# Patient Record
Sex: Male | Born: 1965 | Race: Black or African American | Hispanic: No | Marital: Married | Smoking: Former smoker
Health system: Southern US, Community
[De-identification: ages and names within clinical notes are randomized; demographics above are authoritative.]

## PROBLEM LIST (undated history)

## (undated) DIAGNOSIS — F419 Anxiety disorder, unspecified: Secondary | ICD-10-CM

## (undated) DIAGNOSIS — K5790 Diverticulosis of intestine, part unspecified, without perforation or abscess without bleeding: Secondary | ICD-10-CM

## (undated) DIAGNOSIS — K219 Gastro-esophageal reflux disease without esophagitis: Secondary | ICD-10-CM

## (undated) DIAGNOSIS — M1990 Unspecified osteoarthritis, unspecified site: Secondary | ICD-10-CM

## (undated) DIAGNOSIS — M2550 Pain in unspecified joint: Secondary | ICD-10-CM

## (undated) DIAGNOSIS — H409 Unspecified glaucoma: Secondary | ICD-10-CM

## (undated) DIAGNOSIS — R011 Cardiac murmur, unspecified: Secondary | ICD-10-CM

## (undated) DIAGNOSIS — R51 Headache: Secondary | ICD-10-CM

## (undated) DIAGNOSIS — R519 Headache, unspecified: Secondary | ICD-10-CM

## (undated) DIAGNOSIS — M2540 Effusion, unspecified joint: Secondary | ICD-10-CM

## (undated) DIAGNOSIS — L509 Urticaria, unspecified: Secondary | ICD-10-CM

## (undated) DIAGNOSIS — I10 Essential (primary) hypertension: Secondary | ICD-10-CM

## (undated) DIAGNOSIS — D649 Anemia, unspecified: Secondary | ICD-10-CM

## (undated) DIAGNOSIS — J189 Pneumonia, unspecified organism: Secondary | ICD-10-CM

## (undated) DIAGNOSIS — T783XXA Angioneurotic edema, initial encounter: Secondary | ICD-10-CM

## (undated) HISTORY — PX: OTHER SURGICAL HISTORY: SHX169

## (undated) HISTORY — DX: Angioneurotic edema, initial encounter: T78.3XXA

## (undated) HISTORY — PX: WISDOM TOOTH EXTRACTION: SHX21

## (undated) HISTORY — DX: Urticaria, unspecified: L50.9

## (undated) HISTORY — PX: COLONOSCOPY: SHX174

## (undated) HISTORY — PX: ESOPHAGOGASTRODUODENOSCOPY: SHX1529

## (undated) HISTORY — DX: Anxiety disorder, unspecified: F41.9

## (undated) HISTORY — DX: Gastro-esophageal reflux disease without esophagitis: K21.9

## (undated) HISTORY — DX: Unspecified glaucoma: H40.9

---

## 1998-05-20 HISTORY — PX: OTHER SURGICAL HISTORY: SHX169

## 2014-05-20 HISTORY — PX: COLONOSCOPY: SHX174

## 2014-07-08 ENCOUNTER — Other Ambulatory Visit (HOSPITAL_COMMUNITY): Payer: Self-pay | Admitting: Orthopaedic Surgery

## 2014-07-14 ENCOUNTER — Encounter (HOSPITAL_COMMUNITY): Payer: Self-pay

## 2014-07-14 ENCOUNTER — Encounter (HOSPITAL_COMMUNITY)
Admission: RE | Admit: 2014-07-14 | Discharge: 2014-07-14 | Disposition: A | Discharge status: Home / Self Care | Payer: Managed Care, Other (non HMO) | Source: Ambulatory Visit | Attending: Orthopaedic Surgery | Admitting: Orthopaedic Surgery

## 2014-07-14 ENCOUNTER — Other Ambulatory Visit: Payer: Self-pay

## 2014-07-14 DIAGNOSIS — Z01812 Encounter for preprocedural laboratory examination: Secondary | ICD-10-CM | POA: Insufficient documentation

## 2014-07-14 DIAGNOSIS — M1612 Unilateral primary osteoarthritis, left hip: Secondary | ICD-10-CM | POA: Diagnosis not present

## 2014-07-14 DIAGNOSIS — Z01810 Encounter for preprocedural cardiovascular examination: Secondary | ICD-10-CM | POA: Diagnosis not present

## 2014-07-14 HISTORY — DX: Diverticulosis of intestine, part unspecified, without perforation or abscess without bleeding: K57.90

## 2014-07-14 HISTORY — DX: Essential (primary) hypertension: I10

## 2014-07-14 HISTORY — DX: Unspecified osteoarthritis, unspecified site: M19.90

## 2014-07-14 HISTORY — DX: Anemia, unspecified: D64.9

## 2014-07-14 HISTORY — DX: Pneumonia, unspecified organism: J18.9

## 2014-07-14 HISTORY — DX: Effusion, unspecified joint: M25.40

## 2014-07-14 HISTORY — DX: Cardiac murmur, unspecified: R01.1

## 2014-07-14 HISTORY — DX: Headache, unspecified: R51.9

## 2014-07-14 HISTORY — DX: Headache: R51

## 2014-07-14 HISTORY — DX: Pain in unspecified joint: M25.50

## 2014-07-14 LAB — COMPREHENSIVE METABOLIC PANEL
ALBUMIN: 4.2 g/dL (ref 3.5–5.2)
ALT: 26 U/L (ref 0–53)
ANION GAP: 7 (ref 5–15)
AST: 27 U/L (ref 0–37)
Alkaline Phosphatase: 43 U/L (ref 39–117)
BUN: 14 mg/dL (ref 6–23)
CALCIUM: 9.9 mg/dL (ref 8.4–10.5)
CHLORIDE: 101 mmol/L (ref 96–112)
CO2: 29 mmol/L (ref 19–32)
Creatinine, Ser: 1.17 mg/dL (ref 0.50–1.35)
GFR calc non Af Amer: 71 mL/min — ABNORMAL LOW (ref >90)
GFR, EST AFRICAN AMERICAN: 82 mL/min — AB (ref >90)
GLUCOSE: 94 mg/dL (ref 70–99)
POTASSIUM: 4.3 mmol/L (ref 3.5–5.1)
SODIUM: 137 mmol/L (ref 135–145)
Total Bilirubin: 2 mg/dL — ABNORMAL HIGH (ref 0.3–1.2)
Total Protein: 7.7 g/dL (ref 6.0–8.3)

## 2014-07-14 LAB — CBC
HCT: 41.8 % (ref 39.0–52.0)
Hemoglobin: 13.6 g/dL (ref 13.0–17.0)
MCH: 25.5 pg — ABNORMAL LOW (ref 26.0–34.0)
MCHC: 32.5 g/dL (ref 30.0–36.0)
MCV: 78.3 fL (ref 78.0–100.0)
PLATELETS: 204 10*3/uL (ref 150–400)
RBC: 5.34 MIL/uL (ref 4.22–5.81)
RDW: 14.7 % (ref 11.5–15.5)
WBC: 2.7 10*3/uL — AB (ref 4.0–10.5)

## 2014-07-14 LAB — SURGICAL PCR SCREEN
MRSA, PCR: NEGATIVE
Staphylococcus aureus: NEGATIVE

## 2014-07-14 LAB — APTT: aPTT: 28 seconds (ref 24–37)

## 2014-07-14 LAB — PROTIME-INR
INR: 1.14 (ref 0.00–1.49)
PROTHROMBIN TIME: 14.7 s (ref 11.6–15.2)

## 2014-07-14 NOTE — Progress Notes (Addendum)
Medical Md is Dr.Thomas Whyte   Pt doesn't have a cardiologist  Denies ever having an echo/stress test/heart cath  Denies EKG or CXR in past yr

## 2014-07-14 NOTE — Pre-Procedure Instructions (Signed)
Vincent Holt  07/14/2014   Your procedure is scheduled on:  Tues, Mar 8 @ 1:00 PM  Report to [facility]  at 11:00 AM.  Call this number if you have problems the morning of surgery: [phone]   Remember:   Do not eat food or drink liquids after midnight.   Take these medicines the morning of surgery with A SIP OF WATER:               No Goody's,BC's,Aleve,Aspirin,Ibuprofen,Fish Oil,or any Herbal Medications.   Do not wear jewelry.  Do not wear lotions, powders, or colognes. You may wear deodorant.  Men may shave face and neck.  Do not bring valuables to the hospital.  [facility] is not responsible                  for any belongings or valuables.               Contacts, dentures or bridgework may not be worn into surgery.  Leave suitcase in the car. After surgery it may be brought to your room.  For patients admitted to the hospital, discharge time is determined by your                treatment team.                 Special Instructions:  [facility] - Preparing for Surgery  Before surgery, you can play an important role.  Because skin is not sterile, your skin needs to be as free of germs as possible.  You can reduce the number of germs on you skin by washing with CHG (chlorahexidine gluconate) soap before surgery.  CHG is an antiseptic cleaner which kills germs and bonds with the skin to continue killing germs even after washing.  Please DO NOT use if you have an allergy to CHG or antibacterial soaps.  If your skin becomes reddened/irritated stop using the CHG and inform your nurse when you arrive at Short Stay.  Do not shave (including legs and underarms) for at least 48 hours prior to the first CHG shower.  You may shave your face.  Please follow these instructions carefully:   1.  Shower with CHG Soap the night before surgery and the                                morning of Surgery.  2.  If you choose to wash your hair, wash your hair first as usual with your        normal shampoo.  3.  After you shampoo, rinse your hair and body thoroughly to remove the                      Shampoo.  4.  Use CHG as you would any other liquid soap.  You can apply chg directly       to the skin and wash gently with scrungie or a clean washcloth.  5.  Apply the CHG Soap to your body ONLY FROM THE NECK DOWN.        Do not use on open wounds or open sores.  Avoid contact with your eyes,       ears, mouth and genitals (private parts).  Wash genitals (private parts)       with your normal soap.  6.  Wash thoroughly, paying special attention to the  area where your surgery        will be performed.  7.  Thoroughly rinse your body with warm water from the neck down.  8.  DO NOT shower/wash with your normal soap after using and rinsing off       the CHG Soap.  9.  Pat yourself dry with a clean towel.            10.  Wear clean pajamas.            11.  Place clean sheets on your bed the night of your first shower and do not        sleep with pets.  Day of Surgery  Do not apply any lotions/deoderants the morning of surgery.  Please wear clean clothes to the hospital/surgery center.     Please read over the following fact sheets that you were given: Pain Booklet, Coughing and Deep Breathing, MRSA Information and Surgical Site Infection Prevention

## 2014-07-25 MED ORDER — CEFAZOLIN SODIUM-DEXTROSE 2-3 GM-% IV SOLR
2.0000 g | INTRAVENOUS | Status: AC
  Administered 2014-07-26: 2 g via INTRAVENOUS
  Filled 2014-07-25: qty 50

## 2014-07-26 ENCOUNTER — Inpatient Hospital Stay (HOSPITAL_COMMUNITY): Payer: Managed Care, Other (non HMO) | Admitting: Anesthesiology

## 2014-07-26 ENCOUNTER — Inpatient Hospital Stay (HOSPITAL_COMMUNITY)
Admission: RE | Admit: 2014-07-26 | Discharge: 2014-07-28 | DRG: 470 | Disposition: A | Discharge status: Home Health | Payer: Managed Care, Other (non HMO) | Source: Ambulatory Visit | Attending: Orthopaedic Surgery | Admitting: Orthopaedic Surgery

## 2014-07-26 ENCOUNTER — Inpatient Hospital Stay (HOSPITAL_COMMUNITY): Payer: Managed Care, Other (non HMO)

## 2014-07-26 ENCOUNTER — Encounter (HOSPITAL_COMMUNITY): Payer: Self-pay | Admitting: *Deleted

## 2014-07-26 ENCOUNTER — Encounter (HOSPITAL_COMMUNITY)
Admission: RE | Disposition: A | Discharge status: Home Health | Payer: Self-pay | Source: Ambulatory Visit | Attending: Orthopaedic Surgery

## 2014-07-26 DIAGNOSIS — Z419 Encounter for procedure for purposes other than remedying health state, unspecified: Secondary | ICD-10-CM

## 2014-07-26 DIAGNOSIS — Z87891 Personal history of nicotine dependence: Secondary | ICD-10-CM | POA: Diagnosis not present

## 2014-07-26 DIAGNOSIS — M1612 Unilateral primary osteoarthritis, left hip: Principal | ICD-10-CM | POA: Diagnosis present

## 2014-07-26 DIAGNOSIS — Z96642 Presence of left artificial hip joint: Secondary | ICD-10-CM

## 2014-07-26 DIAGNOSIS — M25552 Pain in left hip: Secondary | ICD-10-CM | POA: Diagnosis present

## 2014-07-26 DIAGNOSIS — I10 Essential (primary) hypertension: Secondary | ICD-10-CM | POA: Diagnosis present

## 2014-07-26 HISTORY — PX: TOTAL HIP ARTHROPLASTY: SHX124

## 2014-07-26 SURGERY — ARTHROPLASTY, HIP, TOTAL, ANTERIOR APPROACH
Anesthesia: General | Site: Hip | Laterality: Left

## 2014-07-26 MED ORDER — ZOLPIDEM TARTRATE 5 MG PO TABS: 5.0000 mg | ORAL_TABLET | Freq: Every evening | ORAL | Status: DC | PRN

## 2014-07-26 MED ORDER — PROPOFOL 10 MG/ML IV BOLUS
INTRAVENOUS | Status: DC | PRN
  Administered 2014-07-26: 200 mg via INTRAVENOUS

## 2014-07-26 MED ORDER — OXYCODONE HCL 5 MG PO TABS
ORAL_TABLET | ORAL | Status: AC
  Administered 2014-07-26: 10 mg via ORAL
  Filled 2014-07-26: qty 2

## 2014-07-26 MED ORDER — SODIUM CHLORIDE 0.9 % IV SOLN: INTRAVENOUS | Status: DC

## 2014-07-26 MED ORDER — NEOSTIGMINE METHYLSULFATE 10 MG/10ML IV SOLN
INTRAVENOUS | Status: DC | PRN
  Administered 2014-07-26: 3 mg via INTRAVENOUS

## 2014-07-26 MED ORDER — ROCURONIUM BROMIDE 100 MG/10ML IV SOLN
INTRAVENOUS | Status: DC | PRN
  Administered 2014-07-26: 30 mg via INTRAVENOUS
  Administered 2014-07-26: 50 mg via INTRAVENOUS

## 2014-07-26 MED ORDER — POLYETHYLENE GLYCOL 3350 17 G PO PACK: 17.0000 g | PACK | Freq: Every day | ORAL | Status: DC | PRN

## 2014-07-26 MED ORDER — DEXAMETHASONE SODIUM PHOSPHATE 4 MG/ML IJ SOLN
INTRAMUSCULAR | Status: AC
  Filled 2014-07-26: qty 1

## 2014-07-26 MED ORDER — PROPOFOL 10 MG/ML IV BOLUS
INTRAVENOUS | Status: AC
  Filled 2014-07-26: qty 20

## 2014-07-26 MED ORDER — ACETAMINOPHEN 325 MG PO TABS
650.0000 mg | ORAL_TABLET | Freq: Four times a day (QID) | ORAL | Status: DC | PRN
  Administered 2014-07-27: 650 mg via ORAL
  Filled 2014-07-26: qty 2

## 2014-07-26 MED ORDER — HYDROMORPHONE HCL 1 MG/ML IJ SOLN
1.0000 mg | INTRAMUSCULAR | Status: DC | PRN
  Administered 2014-07-26 – 2014-07-27 (×3): 1 mg via INTRAVENOUS
  Filled 2014-07-26 (×3): qty 1

## 2014-07-26 MED ORDER — MIDAZOLAM HCL 2 MG/2ML IJ SOLN
INTRAMUSCULAR | Status: AC
  Filled 2014-07-26: qty 2

## 2014-07-26 MED ORDER — OXYCODONE HCL 5 MG PO TABS
5.0000 mg | ORAL_TABLET | ORAL | Status: DC | PRN
  Administered 2014-07-26 – 2014-07-27 (×7): 10 mg via ORAL
  Filled 2014-07-26 (×6): qty 2

## 2014-07-26 MED ORDER — MIDAZOLAM HCL 5 MG/5ML IJ SOLN
INTRAMUSCULAR | Status: DC | PRN
  Administered 2014-07-26: 2 mg via INTRAVENOUS

## 2014-07-26 MED ORDER — METOCLOPRAMIDE HCL 10 MG PO TABS: 5.0000 mg | ORAL_TABLET | Freq: Three times a day (TID) | ORAL | Status: DC | PRN

## 2014-07-26 MED ORDER — DEXTROSE 5 % IV SOLN
500.0000 mg | Freq: Four times a day (QID) | INTRAVENOUS | Status: DC | PRN
  Filled 2014-07-26: qty 5

## 2014-07-26 MED ORDER — ONDANSETRON HCL 4 MG PO TABS: 4.0000 mg | ORAL_TABLET | Freq: Four times a day (QID) | ORAL | Status: DC | PRN

## 2014-07-26 MED ORDER — CEFAZOLIN SODIUM 1-5 GM-% IV SOLN
1.0000 g | Freq: Four times a day (QID) | INTRAVENOUS | Status: AC
  Administered 2014-07-26 – 2014-07-27 (×2): 1 g via INTRAVENOUS
  Filled 2014-07-26 (×3): qty 50

## 2014-07-26 MED ORDER — SODIUM CHLORIDE 0.9 % IR SOLN
Status: DC | PRN
  Administered 2014-07-26: 1000 mL

## 2014-07-26 MED ORDER — MENTHOL 3 MG MT LOZG: 1.0000 | LOZENGE | OROMUCOSAL | Status: DC | PRN

## 2014-07-26 MED ORDER — METHOCARBAMOL 500 MG PO TABS
500.0000 mg | ORAL_TABLET | Freq: Four times a day (QID) | ORAL | Status: DC | PRN
Start: 2014-07-26 — End: 2014-07-28
  Administered 2014-07-26 – 2014-07-27 (×3): 500 mg via ORAL
  Filled 2014-07-26 (×4): qty 1

## 2014-07-26 MED ORDER — GLYCOPYRROLATE 0.2 MG/ML IJ SOLN
INTRAMUSCULAR | Status: DC | PRN
  Administered 2014-07-26: 0.2 mg via INTRAVENOUS
  Administered 2014-07-26: 0.4 mg via INTRAVENOUS

## 2014-07-26 MED ORDER — DEXAMETHASONE SODIUM PHOSPHATE 4 MG/ML IJ SOLN
INTRAMUSCULAR | Status: DC | PRN
  Administered 2014-07-26: 4 mg via INTRAVENOUS

## 2014-07-26 MED ORDER — TRANEXAMIC ACID 100 MG/ML IV SOLN
1000.0000 mg | INTRAVENOUS | Status: AC
  Administered 2014-07-26: 1000 mg via INTRAVENOUS
  Filled 2014-07-26: qty 10

## 2014-07-26 MED ORDER — FENTANYL CITRATE 0.05 MG/ML IJ SOLN
INTRAMUSCULAR | Status: AC
  Filled 2014-07-26: qty 5

## 2014-07-26 MED ORDER — HYDROMORPHONE HCL 1 MG/ML IJ SOLN
0.2500 mg | INTRAMUSCULAR | Status: DC | PRN
  Administered 2014-07-26 (×2): 0.5 mg via INTRAVENOUS

## 2014-07-26 MED ORDER — PHENOL 1.4 % MT LIQD: 1.0000 | OROMUCOSAL | Status: DC | PRN

## 2014-07-26 MED ORDER — ROCURONIUM BROMIDE 50 MG/5ML IV SOLN
INTRAVENOUS | Status: AC
  Filled 2014-07-26: qty 1

## 2014-07-26 MED ORDER — METOCLOPRAMIDE HCL 5 MG/ML IJ SOLN: 5.0000 mg | Freq: Three times a day (TID) | INTRAMUSCULAR | Status: DC | PRN

## 2014-07-26 MED ORDER — BISOPROLOL-HYDROCHLOROTHIAZIDE 2.5-6.25 MG PO TABS
2.0000 | ORAL_TABLET | Freq: Every day | ORAL | Status: DC
  Administered 2014-07-27 – 2014-07-28 (×2): 2 via ORAL
  Filled 2014-07-26 (×2): qty 2

## 2014-07-26 MED ORDER — DIPHENHYDRAMINE HCL 12.5 MG/5ML PO ELIX: 12.5000 mg | ORAL_SOLUTION | ORAL | Status: DC | PRN

## 2014-07-26 MED ORDER — LIDOCAINE HCL (CARDIAC) 20 MG/ML IV SOLN
INTRAVENOUS | Status: DC | PRN
  Administered 2014-07-26: 80 mg via INTRAVENOUS

## 2014-07-26 MED ORDER — HYDROMORPHONE HCL 1 MG/ML IJ SOLN
INTRAMUSCULAR | Status: AC
  Administered 2014-07-26: 0.5 mg via INTRAVENOUS
  Filled 2014-07-26: qty 1

## 2014-07-26 MED ORDER — ONDANSETRON HCL 4 MG/2ML IJ SOLN
INTRAMUSCULAR | Status: DC | PRN
  Administered 2014-07-26: 4 mg via INTRAVENOUS

## 2014-07-26 MED ORDER — PROMETHAZINE HCL 25 MG/ML IJ SOLN
6.2500 mg | INTRAMUSCULAR | Status: DC | PRN
Start: 2014-07-26 — End: 2014-07-26

## 2014-07-26 MED ORDER — ALUM & MAG HYDROXIDE-SIMETH 200-200-20 MG/5ML PO SUSP
30.0000 mL | ORAL | Status: DC | PRN
Start: 2014-07-26 — End: 2014-07-28

## 2014-07-26 MED ORDER — FENTANYL CITRATE 0.05 MG/ML IJ SOLN
INTRAMUSCULAR | Status: DC | PRN
  Administered 2014-07-26: 50 ug via INTRAVENOUS
  Administered 2014-07-26: 150 ug via INTRAVENOUS
  Administered 2014-07-26: 50 ug via INTRAVENOUS

## 2014-07-26 MED ORDER — LACTATED RINGERS IV SOLN
INTRAVENOUS | Status: DC
  Administered 2014-07-26 (×2): via INTRAVENOUS

## 2014-07-26 MED ORDER — GLYCOPYRROLATE 0.2 MG/ML IJ SOLN
INTRAMUSCULAR | Status: AC
  Filled 2014-07-26: qty 2

## 2014-07-26 MED ORDER — GLYCOPYRROLATE 0.2 MG/ML IJ SOLN
INTRAMUSCULAR | Status: AC
  Filled 2014-07-26: qty 1

## 2014-07-26 MED ORDER — DOCUSATE SODIUM 100 MG PO CAPS
100.0000 mg | ORAL_CAPSULE | Freq: Two times a day (BID) | ORAL | Status: DC
  Administered 2014-07-26 – 2014-07-27 (×3): 100 mg via ORAL
  Filled 2014-07-26 (×6): qty 1

## 2014-07-26 MED ORDER — ACETAMINOPHEN 650 MG RE SUPP: 650.0000 mg | Freq: Four times a day (QID) | RECTAL | Status: DC | PRN

## 2014-07-26 MED ORDER — EPHEDRINE SULFATE 50 MG/ML IJ SOLN
INTRAMUSCULAR | Status: DC | PRN
  Administered 2014-07-26 (×3): 5 mg via INTRAVENOUS

## 2014-07-26 MED ORDER — 0.9 % SODIUM CHLORIDE (POUR BTL) OPTIME
TOPICAL | Status: DC | PRN
  Administered 2014-07-26: 1000 mL

## 2014-07-26 MED ORDER — ASPIRIN EC 325 MG PO TBEC
325.0000 mg | DELAYED_RELEASE_TABLET | Freq: Two times a day (BID) | ORAL | Status: DC
  Administered 2014-07-26 – 2014-07-28 (×4): 325 mg via ORAL
  Filled 2014-07-26 (×4): qty 1

## 2014-07-26 MED ORDER — ONDANSETRON HCL 4 MG/2ML IJ SOLN: 4.0000 mg | Freq: Four times a day (QID) | INTRAMUSCULAR | Status: DC | PRN

## 2014-07-26 SURGICAL SUPPLY — 60 items
BENZOIN TINCTURE PRP APPL 2/3 (GAUZE/BANDAGES/DRESSINGS) ×3 IMPLANT
BLADE SAW SGTL 18X1.27X75 (BLADE) ×2 IMPLANT
BLADE SAW SGTL 18X1.27X75MM (BLADE) ×1
BLADE SURG ROTATE 9660 (MISCELLANEOUS) IMPLANT
BNDG GAUZE ELAST 4 BULKY (GAUZE/BANDAGES/DRESSINGS) IMPLANT
CAPT HIP TOTAL 2 ×3 IMPLANT
CELLS DAT CNTRL 66122 CELL SVR (MISCELLANEOUS) ×1 IMPLANT
CLOSURE WOUND 1/2 X4 (GAUZE/BANDAGES/DRESSINGS) ×2
COVER SURGICAL LIGHT HANDLE (MISCELLANEOUS) ×3 IMPLANT
DRAPE C-ARM 42X72 X-RAY (DRAPES) ×3 IMPLANT
DRAPE IMP U-DRAPE 54X76 (DRAPES) ×3 IMPLANT
DRAPE STERI IOBAN 125X83 (DRAPES) ×3 IMPLANT
DRAPE U-SHAPE 47X51 STRL (DRAPES) ×9 IMPLANT
DRESSING AQUACEL AQ EXTRA 4X5 (GAUZE/BANDAGES/DRESSINGS) ×3 IMPLANT
DRSG AQUACEL AG ADV 3.5X10 (GAUZE/BANDAGES/DRESSINGS) ×3 IMPLANT
DURAPREP 26ML APPLICATOR (WOUND CARE) ×3 IMPLANT
ELECT BLADE 4.0 EZ CLEAN MEGAD (MISCELLANEOUS)
ELECT BLADE 6.5 EXT (BLADE) ×3 IMPLANT
ELECT CAUTERY BLADE 6.4 (BLADE) ×3 IMPLANT
ELECT REM PT RETURN 9FT ADLT (ELECTROSURGICAL) ×3
ELECTRODE BLDE 4.0 EZ CLN MEGD (MISCELLANEOUS) IMPLANT
ELECTRODE REM PT RTRN 9FT ADLT (ELECTROSURGICAL) ×1 IMPLANT
FACESHIELD WRAPAROUND (MASK) ×6 IMPLANT
GLOVE BIO SURGEON STRL SZ 6.5 (GLOVE) ×2 IMPLANT
GLOVE BIO SURGEON STRL SZ7 (GLOVE) ×3 IMPLANT
GLOVE BIO SURGEONS STRL SZ 6.5 (GLOVE) ×1
GLOVE BIOGEL PI IND STRL 7.5 (GLOVE) ×1 IMPLANT
GLOVE BIOGEL PI IND STRL 8 (GLOVE) ×2 IMPLANT
GLOVE BIOGEL PI INDICATOR 7.5 (GLOVE) ×2
GLOVE BIOGEL PI INDICATOR 8 (GLOVE) ×4
GLOVE ECLIPSE 6.5 STRL STRAW (GLOVE) ×3 IMPLANT
GLOVE ECLIPSE 8.0 STRL XLNG CF (GLOVE) ×3 IMPLANT
GLOVE ORTHO TXT STRL SZ7.5 (GLOVE) ×6 IMPLANT
GOWN STRL REUS W/ TWL XL LVL3 (GOWN DISPOSABLE) ×2 IMPLANT
GOWN STRL REUS W/TWL XL LVL3 (GOWN DISPOSABLE) ×4
HANDPIECE INTERPULSE COAX TIP (DISPOSABLE) ×2
KIT BASIN OR (CUSTOM PROCEDURE TRAY) ×3 IMPLANT
KIT ROOM TURNOVER OR (KITS) ×3 IMPLANT
MANIFOLD NEPTUNE II (INSTRUMENTS) ×3 IMPLANT
NS IRRIG 1000ML POUR BTL (IV SOLUTION) ×3 IMPLANT
PACK TOTAL JOINT (CUSTOM PROCEDURE TRAY) ×3 IMPLANT
PACK UNIVERSAL I (CUSTOM PROCEDURE TRAY) ×3 IMPLANT
PAD ARMBOARD 7.5X6 YLW CONV (MISCELLANEOUS) ×6 IMPLANT
RTRCTR WOUND ALEXIS 18CM MED (MISCELLANEOUS) ×3
SET HNDPC FAN SPRY TIP SCT (DISPOSABLE) ×1 IMPLANT
SPONGE LAP 18X18 X RAY DECT (DISPOSABLE) ×3 IMPLANT
SPONGE LAP 4X18 X RAY DECT (DISPOSABLE) IMPLANT
STRIP CLOSURE SKIN 1/2X4 (GAUZE/BANDAGES/DRESSINGS) ×4 IMPLANT
SUT ETHIBOND NAB CT1 #1 30IN (SUTURE) ×3 IMPLANT
SUT MNCRL AB 4-0 PS2 18 (SUTURE) ×3 IMPLANT
SUT VIC AB 0 CT1 27 (SUTURE) ×2
SUT VIC AB 0 CT1 27XBRD ANBCTR (SUTURE) ×1 IMPLANT
SUT VIC AB 1 CT1 27 (SUTURE) ×2
SUT VIC AB 1 CT1 27XBRD ANBCTR (SUTURE) ×1 IMPLANT
SUT VIC AB 2-0 CT1 27 (SUTURE) ×2
SUT VIC AB 2-0 CT1 TAPERPNT 27 (SUTURE) ×1 IMPLANT
TOWEL OR 17X24 6PK STRL BLUE (TOWEL DISPOSABLE) ×3 IMPLANT
TOWEL OR 17X26 10 PK STRL BLUE (TOWEL DISPOSABLE) ×3 IMPLANT
TUBE SUCT ARGYLE STRL (TUBING) ×3 IMPLANT
WATER STERILE IRR 1000ML POUR (IV SOLUTION) ×6 IMPLANT

## 2014-07-26 NOTE — Brief Op Note (Signed)
07/26/2014  2:39 PM  PATIENT:  Vincent Holt  49 y.o. male  PRE-OPERATIVE DIAGNOSIS:  Severe osteoarthritis left hip  POST-OPERATIVE DIAGNOSIS:  Severe osteoarthritis left hip  PROCEDURE:  Procedure(s): LEFT TOTAL HIP ARTHROPLASTY ANTERIOR APPROACH (Left)  SURGEON:  Surgeon(s) and Role:    * Mcarthur Rossetti, MD - Primary  PHYSICIAN ASSISTANT: Benita Stabile, PA-C  ANESTHESIA:   general  EBL:  Total I/O In: 1000 [I.V.:1000] Out: 200 [Blood:200]  BLOOD ADMINISTERED:none  DRAINS: none   LOCAL MEDICATIONS USED:  NONE  SPECIMEN:  No Specimen  DISPOSITION OF SPECIMEN:  N/A  COUNTS:  YES  TOURNIQUET:  * No tourniquets in log *  DICTATION: .Other Dictation: Dictation Number [id number]  PLAN OF CARE: Admit to inpatient   PATIENT DISPOSITION:  PACU - hemodynamically stable.   Delay start of Pharmacological VTE agent (>24hrs) due to surgical blood loss or risk of bleeding: no

## 2014-07-26 NOTE — Anesthesia Preprocedure Evaluation (Addendum)
Anesthesia Evaluation  Patient identified by MRN, date of birth, ID band Patient awake    Reviewed: Allergy & Precautions, NPO status , Patient's Chart, lab work & pertinent test results  Airway Mallampati: I  TM Distance: >3 FB Neck ROM: Full    Dental  (+) Teeth Intact   Pulmonary pneumonia -, former smoker,  breath sounds clear to auscultation        Cardiovascular hypertension, Pt. on medications Rhythm:Regular Rate:Normal     Neuro/Psych    GI/Hepatic negative GI ROS, Neg liver ROS,   Endo/Other    Renal/GU negative Renal ROS     Musculoskeletal  (+) Arthritis -,   Abdominal   Peds  Hematology negative hematology ROS (+)   Anesthesia Other Findings   Reproductive/Obstetrics                            Anesthesia Physical Anesthesia Plan  ASA: II  Anesthesia Plan: General   Post-op Pain Management:    Induction: Intravenous  Airway Management Planned: Oral ETT  Additional Equipment:   Intra-op Plan:   Post-operative Plan: Extubation in OR  Informed Consent: I have reviewed the patients History and Physical, chart, labs and discussed the procedure including the risks, benefits and alternatives for the proposed anesthesia with the patient or authorized representative who has indicated his/her understanding and acceptance.   Dental advisory given  Plan Discussed with: CRNA, Anesthesiologist and Surgeon  Anesthesia Plan Comments:         Anesthesia Quick Evaluation

## 2014-07-26 NOTE — Anesthesia Postprocedure Evaluation (Signed)
  Anesthesia Post-op Note  Patient: Vincent Holt  Procedure(s) Performed: Procedure(s): LEFT TOTAL HIP ARTHROPLASTY ANTERIOR APPROACH (Left)  Patient Location: PACU  Anesthesia Type:General  Level of Consciousness: awake  Airway and Oxygen Therapy: Patient Spontanous Breathing  Post-op Pain: mild  Post-op Assessment: Post-op Vital signs reviewed  Post-op Vital Signs: Reviewed  Last Vitals:  Filed Vitals:   07/26/14 1500  BP: 145/112  Pulse: 71  Temp: 36.9 C  Resp: 18    Complications: No apparent anesthesia complications

## 2014-07-26 NOTE — Anesthesia Procedure Notes (Signed)
Procedure Name: Intubation Date/Time: 07/26/2014 1:11 PM Performed by: Rush Farmer E Pre-anesthesia Checklist: Patient identified, Emergency Drugs available, Suction available, Patient being monitored and Timeout performed Patient Re-evaluated:Patient Re-evaluated prior to inductionOxygen Delivery Method: Circle system utilized Preoxygenation: Pre-oxygenation with 100% oxygen Intubation Type: IV induction Ventilation: Mask ventilation without difficulty Laryngoscope Size: Mac and 3 Grade View: Grade I Tube type: Oral Tube size: 7.5 mm Number of attempts: 1 Airway Equipment and Method: Stylet Placement Confirmation: ETT inserted through vocal cords under direct vision,  positive ETCO2 and breath sounds checked- equal and bilateral Tube secured with: Tape Dental Injury: Teeth and Oropharynx as per pre-operative assessment

## 2014-07-26 NOTE — Transfer of Care (Signed)
Immediate Anesthesia Transfer of Care Note  Patient: Vincent Holt  Procedure(s) Performed: Procedure(s): LEFT TOTAL HIP ARTHROPLASTY ANTERIOR APPROACH (Left)  Patient Location: PACU  Anesthesia Type:General  Level of Consciousness: awake, alert  and oriented  Airway & Oxygen Therapy: Patient Spontanous Breathing and Patient connected to nasal cannula oxygen  Post-op Assessment: Report given to RN, Post -op Vital signs reviewed and stable and Patient moving all extremities  Post vital signs: Reviewed and stable  Last Vitals:  Filed Vitals:   07/26/14 1100  BP:   Pulse: 66  Temp: 36.8 C  Resp: 18    Complications: No apparent anesthesia complications

## 2014-07-26 NOTE — H&P (Signed)
TOTAL HIP ADMISSION H&P  Patient is admitted for left total hip arthroplasty.  Subjective:  Chief Complaint: left hip pain  HPI: Vincent Holt, 49 y.o. male, has a history of pain and functional disability in the left hip(s) due to arthritis and patient has failed non-surgical conservative treatments for greater than 12 weeks to include NSAID's and/or analgesics and activity modification.  Onset of symptoms was gradual starting 8 years ago with gradually worsening course since that time.The patient noted no past surgery on the left hip(s).  Patient currently rates pain in the left hip at 10 out of 10 with activity. Patient has night pain, worsening of pain with activity and weight bearing, pain that interfers with activities of daily living and pain with passive range of motion. Patient has evidence of subchondral sclerosis, periarticular osteophytes and joint space narrowing by imaging studies. This condition presents safety issues increasing the risk of falls.  There is no current active infection.  Patient Active Problem List   Diagnosis Date Noted  . Osteoarthritis of left hip 07/26/2014   Past Medical History  Diagnosis Date  . Anemia   . Hypertension     takes Bisoprolol-HCTZ daily  . Heart murmur     was told yrs ago  . Pneumonia     hx of-80's  . Headache   . Arthritis   . Joint pain   . Joint swelling   . Osteoarthritis   . Diverticulosis     Past Surgical History  Procedure Laterality Date  . Achiles tendon surgery Left   . Colonoscopy    . Esophagogastroduodenoscopy      Prescriptions prior to admission  Medication Sig Dispense Refill Last Dose  . bisoprolol-hydrochlorothiazide (ZIAC) 2.5-6.25 MG per tablet Take 2 tablets by mouth daily.   07/26/2014 at 0730  . Omalizumab (XOLAIR Knierim) Inject 1 each into the skin every 30 (thirty) days.   Past Month at Unknown time  . triamcinolone cream (KENALOG) 0.1 % Apply 1 application topically daily as needed (irritation).   Past  Week at Unknown time   No Known Allergies  History  Substance Use Topics  . Smoking status: Former Research scientist (life sciences)  . Smokeless tobacco: Not on file     Comment: quit smoking 20+yrs ago  . Alcohol Use: Yes     Comment: daily    History reviewed. No pertinent family history.   Review of Systems  Musculoskeletal: Positive for joint pain.  All other systems reviewed and are negative.   Objective:  Physical Exam  Constitutional: He is oriented to person, place, and time. He appears well-developed and well-nourished.  HENT:  Head: Normocephalic and atraumatic.  Eyes: EOM are normal. Pupils are equal, round, and reactive to light.  Neck: Normal range of motion. Neck supple.  Cardiovascular: Normal rate and regular rhythm.   Respiratory: Effort normal and breath sounds normal.  GI: Soft. Bowel sounds are normal.  Musculoskeletal:       Left hip: He exhibits decreased range of motion, decreased strength and bony tenderness.  Neurological: He is alert and oriented to person, place, and time.  Skin: Skin is warm and dry.  Psychiatric: He has a normal mood and affect.    Vital signs in last 24 hours: Temp:  [98.3 F (36.8 C)] 98.3 F (36.8 C) (03/08 1100) Pulse Rate:  [66] 66 (03/08 1100) Resp:  [18] 18 (03/08 1100) BP: (169)/(96) 169/96 mmHg (03/08 1057) SpO2:  [100 %] 100 % (03/08 1100) Weight:  [85.73 kg (  189 lb)] 85.73 kg (189 lb) (03/08 1051)  Labs:   Estimated body mass index is 28.74 kg/(m^2) as calculated from the following:   Height as of this encounter: 5\' 8"  (1.727 m).   Weight as of this encounter: 85.73 kg (189 lb).   Imaging Review Plain radiographs demonstrate severe degenerative joint disease of the left hip(s). The bone quality appears to be excellent for age and reported activity level.  Assessment/Plan:  End stage arthritis, left hip(s)  The patient history, physical examination, clinical judgement of the provider and imaging studies are consistent with end  stage degenerative joint disease of the left hip(s) and total hip arthroplasty is deemed medically necessary. The treatment options including medical management, injection therapy, arthroscopy and arthroplasty were discussed at length. The risks and benefits of total hip arthroplasty were presented and reviewed. The risks due to aseptic loosening, infection, stiffness, dislocation/subluxation,  thromboembolic complications and other imponderables were discussed.  The patient acknowledged the explanation, agreed to proceed with the plan and consent was signed. Patient is being admitted for inpatient treatment for surgery, pain control, PT, OT, prophylactic antibiotics, VTE prophylaxis, progressive ambulation and ADL's and discharge planning.The patient is planning to be discharged home with home health services

## 2014-07-26 NOTE — Plan of Care (Signed)
Problem: Consults Goal: Diagnosis- Total Joint Replacement Primary Total Hip Left     

## 2014-07-27 LAB — BASIC METABOLIC PANEL
Anion gap: 8 (ref 5–15)
BUN: 13 mg/dL (ref 6–23)
CO2: 28 mmol/L (ref 19–32)
CREATININE: 1.14 mg/dL (ref 0.50–1.35)
Calcium: 8.9 mg/dL (ref 8.4–10.5)
Chloride: 98 mmol/L (ref 96–112)
GFR calc Af Amer: 85 mL/min — ABNORMAL LOW (ref >90)
GFR calc non Af Amer: 73 mL/min — ABNORMAL LOW (ref >90)
Glucose, Bld: 122 mg/dL — ABNORMAL HIGH (ref 70–99)
POTASSIUM: 3.7 mmol/L (ref 3.5–5.1)
Sodium: 134 mmol/L — ABNORMAL LOW (ref 135–145)

## 2014-07-27 LAB — CBC
HCT: 34.5 % — ABNORMAL LOW (ref 39.0–52.0)
Hemoglobin: 11.1 g/dL — ABNORMAL LOW (ref 13.0–17.0)
MCH: 25.1 pg — ABNORMAL LOW (ref 26.0–34.0)
MCHC: 32.2 g/dL (ref 30.0–36.0)
MCV: 78.1 fL (ref 78.0–100.0)
Platelets: 176 10*3/uL (ref 150–400)
RBC: 4.42 MIL/uL (ref 4.22–5.81)
RDW: 14.1 % (ref 11.5–15.5)
WBC: 5.9 10*3/uL (ref 4.0–10.5)

## 2014-07-27 NOTE — Evaluation (Signed)
Physical Therapy Evaluation Patient Details Name: Vincent Holt MRN: [id number] DOB: [birth year] Today's Date: 07/27/2014   History of Present Illness  [name], 49 y.o. male, has a history of pain and functional disability in the left hip(s) due to arthritis and patient has failed non-surgical conservative treatments for greater than 12 weeks to include NSAID's and/or analgesics and activity modification. Patient has evidence of subchondral sclerosis, periarticular osteophytes and joint space narrowing by imaging studies.  Pt underwent L direct anterior approach THA on 07/26/2014.  Clinical Impression  Future presents with decreased ROM, strength, proprioception and balance secondary to L direct anterior approach THA.  Pt is unable to stand without UE assist/min contact guard, and demonstrated postural/LE fatigue with ambulation less than community distances.  Pt would benefit from skilled therapy to address above functional limitations and impairments.     Follow Up Recommendations Home health PT;Supervision - Intermittent    Equipment Recommendations  Rolling walker with 5" wheels    Recommendations for Other Services       Precautions / Restrictions Precautions Precautions: Anterior Hip Precaution Booklet Issued: No (direct approach) Precaution Comments: Direct Approach, No Precautions Restrictions Weight Bearing Restrictions: Yes LLE Weight Bearing: Weight bearing as tolerated      Mobility  Bed Mobility Overal bed mobility: Modified Independent                Transfers Overall transfer level: Needs assistance Equipment used: Rolling walker (2 wheeled) Transfers: Sit to/from Stand Sit to Stand: Supervision         General transfer comment: v/c's for hand placement to transfer sit to stand  Ambulation/Gait Ambulation/Gait assistance: Supervision Ambulation Distance (Feet): 150 Feet Assistive device: Rolling walker (2 wheeled) Gait Pattern/deviations:  Decreased step length - left;Step-to pattern;Decreased weight shift to left Gait velocity: Slow/guarded due pain and post-op status      Stairs            Wheelchair Mobility    Modified Rankin (Stroke Patients Only)       Balance Overall balance assessment: Needs assistance Sitting-balance support: No upper extremity supported;Feet supported Sitting balance-Leahy Scale: Good     Standing balance support: Bilateral upper extremity supported;During functional activity Standing balance-Leahy Scale: Fair Standing balance comment: Able to stand >5 minutes to  brush teeth, but needed U UE support                             Pertinent Vitals/Pain Pain Assessment: 0-10 Pain Score: 8  Pain Location: LLE Pain Descriptors / Indicators: Operative site guarding Pain Intervention(s): Monitored during session;Repositioned    Home Living Family/patient expects to be discharged to:: Private residence Living Arrangements: Children Available Help at Discharge: Other (Comment) (daughter and husband to live with patient for awhile) Type of Home: House Home Access: Stairs to enter Entrance Stairs-Rails: Left Entrance Stairs-Number of Steps: 1 Home Layout: One level Home Equipment: None      Prior Function Level of Independence: Independent         Comments: Prior to surgery worked two jobs     Journalist, newspaper   Dominant Hand: Right    Extremity/Trunk Assessment   Upper Extremity Assessment: Overall WFL for tasks assessed           Lower Extremity Assessment: LLE deficits/detail   LLE Deficits / Details: Able to perform hip and knee flexion in bed with limited range, able to ambulate with rolling walker  Cervical /  Trunk Assessment: Normal  Communication   Communication: No difficulties  Cognition Arousal/Alertness: Awake/alert Behavior During Therapy: WFL for tasks assessed/performed Overall Cognitive Status: Within Functional Limits for tasks  assessed                      General Comments      Exercises Total Joint Exercises Ankle Circles/Pumps: Seated;AROM;Both;10 reps Long Arc Quad: Seated;AROM;10 reps;Left      Assessment/Plan    PT Assessment Patient needs continued PT services  PT Diagnosis Acute pain;Difficulty walking;Generalized weakness   PT Problem List Decreased strength;Decreased range of motion;Decreased activity tolerance;Decreased balance;Decreased mobility;Decreased coordination;Decreased knowledge of use of DME;Pain  PT Treatment Interventions DME instruction;Gait training;Stair training;Functional mobility training;Therapeutic activities;Balance training;Therapeutic exercise;Neuromuscular re-education;Patient/family education   PT Goals (Current goals can be found in the Care Plan section) Acute Rehab PT Goals Patient Stated Goal: Return home PT Goal Formulation: With patient Time For Goal Achievement: 08/03/14 Potential to Achieve Goals: Good    Frequency 7X/week   Barriers to discharge        Co-evaluation               End of Session Equipment Utilized During Treatment: Gait belt Activity Tolerance: Patient tolerated treatment well Patient left: in chair;with call bell/phone within reach Nurse Communication: Mobility status         Time: 4580-9983 PT Time Calculation (min) (ACUTE ONLY): 35 min   Charges:         PT G Codes:        Sabrea Sankey 08/08/14, 11:53 AM  Lucas Mallow, SPT (student physical therapist) Office phone: [phone]

## 2014-07-27 NOTE — Progress Notes (Signed)
Physical Therapy Treatment Patient Details Name: Vincent Holt MRN: [id number] DOB: April 04, 1966 Today's Date: 07/27/2014    History of Present Illness Vincent Holt, 49 y.o. male, has a history of pain and functional disability in the left hip(s) due to arthritis and patient has failed non-surgical conservative treatments for greater than 12 weeks to include NSAID's and/or analgesics and activity modification. Patient has evidence of subchondral sclerosis, periarticular osteophytes and joint space narrowing by imaging studies.  Pt underwent direct anterior approach THA on 07/26/2014.    PT Comments    Pt tolerated treatment well and was able to navigate 2 stairs with railing on the L with a side-step pattern, with supervision.  Pt continues to progress well, but will benefit from continued therapy to address ambulation and decreased ROM, strength and balance/proprioception post-operatively.    Follow Up Recommendations  Home health PT     Equipment Recommendations  Rolling walker with 5" wheels    Recommendations for Other Services       Precautions / Restrictions Precautions Precautions: None Precaution Booklet Issued: No (direct approach) Precaution Comments: Direct Approach, No Precautions Restrictions Weight Bearing Restrictions: Yes LLE Weight Bearing: Weight bearing as tolerated    Mobility  Bed Mobility                  Transfers Overall transfer level: Modified independent Equipment used: Rolling walker (2 wheeled) Transfers: Sit to/from Stand Sit to Stand: Supervision         General transfer comment: demonstrated understanding of hand placement for sit to stand transfer  Ambulation/Gait Ambulation/Gait assistance: Supervision Ambulation Distance (Feet): 150 Feet Assistive device: Rolling walker (2 wheeled) Gait Pattern/deviations: Decreased weight shift to left;Decreased step length - left Gait velocity: Guarded/slow due to post-op status   General  Gait Details: Worked on fluidity of gait and heel to toe gait pattern; transitioning to step through pattern   Stairs Stairs: Yes Stairs assistance: Supervision Stair Management: One rail Left Number of Stairs: 2 General stair comments: Able to ambulate 2 stairs side-stepping, facing L, without increase in pain/symtpoms  Wheelchair Mobility    Modified Rankin (Stroke Patients Only)       Balance Overall balance assessment: Needs assistance     Standing balance support: Bilateral upper extremity supported;During functional activity Standing balance-Leahy Scale: Fair Standing balance comment: Tolerated standing transition to stairs well; able to tolerate some standing balance exercises with minimal bilateral UE support                    Cognition Arousal/Alertness: Awake/alert Behavior During Therapy: WFL for tasks assessed/performed Overall Cognitive Status: Within Functional Limits for tasks assessed                      Exercises Total Joint Exercises Hip ABduction/ADduction: AROM;Standing;Left Marching in Standing: 10 reps;Both;Standing (AAROM (assist from PT) on L, AROM on R) Standing Hip Extension: 10 reps;AROM;Left (Modified range due to anterior incision)    General Comments        Pertinent Vitals/Pain Pain Assessment: 0-10 Pain Score: 8  Pain Location: Hip, worse with movement Pain Descriptors / Indicators: Dull Pain Intervention(s): Monitored during session    Home Living Family/patient expects to be discharged to:: Private residence Living Arrangements: Children Available Help at Discharge: Other (Comment) (daughter and husband to live with patient for awhile) Type of Home: House Home Access: Stairs to enter Entrance Stairs-Rails: Left Home Layout: One level Home Equipment: None  Prior Function Level of Independence: Independent      Comments: Prior to surgery worked two jobs   PT Goals (current goals can now be found in  the care plan section) Acute Rehab PT Goals Patient Stated Goal: Return home    Frequency  7X/week    PT Plan Current plan remains appropriate    Co-evaluation             End of Session Equipment Utilized During Treatment: Gait belt Activity Tolerance: Patient tolerated treatment well Patient left: in chair;with call bell/phone within reach;with family/visitor present     Time: 1205-1225 PT Time Calculation (min) (ACUTE ONLY): 20 min  Charges:                       G Codes:      Vincent Holt 2014-07-29, 1:11 PM  Lucas Mallow, SPT (student physical therapist) Office phone: [phone]

## 2014-07-27 NOTE — Progress Notes (Signed)
Subjective: 1 Day Post-Op Procedure(s) (LRB): LEFT TOTAL HIP ARTHROPLASTY ANTERIOR APPROACH (Left) Patient reports pain as moderate.    Objective: Vital signs in last 24 hours: Temp:  [97.1 F (36.2 C)-98.7 F (37.1 C)] 98.7 F (37.1 C) (03/09 0409) Pulse Rate:  [57-77] 68 (03/09 0409) Resp:  [9-21] 16 (03/09 0409) BP: (117-169)/(69-112) 130/73 mmHg (03/09 0409) SpO2:  [98 %-100 %] 98 % (03/09 0409) Weight:  [85.73 kg (189 lb)] 85.73 kg (189 lb) (03/08 1051)  Intake/Output from previous day: 03/08 0701 - 03/09 0700 In: 1480 [P.O.:280; I.V.:1200] Out: 1275 [Urine:1075; Blood:200] Intake/Output this shift:     Recent Labs  07/27/14 0615  HGB 11.1*    Recent Labs  07/27/14 0615  WBC 5.9  RBC 4.42  HCT 34.5*  PLT 176   No results for input(s): NA, K, CL, CO2, BUN, CREATININE, GLUCOSE, CALCIUM in the last 72 hours. No results for input(s): LABPT, INR in the last 72 hours.  Sensation intact distally Intact pulses distally Dorsiflexion/Plantar flexion intact Incision: dressing C/D/I  Assessment/Plan: 1 Day Post-Op Procedure(s) (LRB): LEFT TOTAL HIP ARTHROPLASTY ANTERIOR APPROACH (Left) Up with therapy  Vincent Holt Y 07/27/2014, 7:16 AM

## 2014-07-27 NOTE — Op Note (Signed)
NAMEXIOMAR, CROMPTON NO.:  [id number]  MEDICAL RECORD NO.:  [id number]  LOCATION:  5N06C                        FACILITY:  [facility]  PHYSICIAN:  Lind Guest. Ninfa Linden, M.D.DATE OF BIRTH:  1965-10-13  DATE OF PROCEDURE:  07/26/2014 DATE OF DISCHARGE:                              OPERATIVE REPORT   PREOPERATIVE DIAGNOSES:  Primary osteoarthritis and degenerative joint disease of left hip.  POSTOPERATIVE DIAGNOSES:  Primary osteoarthritis and degenerative joint disease of left hip.  PROCEDURE:  Left total hip arthroplasty through direct anterior approach.  IMPLANTS:  DePuy Sector Gription acetabular component size 52 with apex hole eliminator and a single screw, size 36+ 4 neutral polyethylene liner, size 11 Corail femoral component with standard offset, size 36+ 1.5 ceramic hip ball.  SURGEON:  Lind Guest. Ninfa Linden, M.D.  ASSISTANT:  Erskine Emery, P.A.  ANESTHESIA:  General.  ANTIBIOTICS:  2 g of IV Ancef.  BLOOD LOSS:  200-250 mL.  COMPLICATIONS:  None.  INDICATIONS:  Vincent Holt is a very pleasant active 49 year old who has had about 8-year history of slowly worsening left hip pain.  He did come to the office this year and x-rays were obtained that showed significant osteoarthritis involving his left hip.  There was almost complete loss of his joint space, sclerotic, and cystic changes, as well as joint space narrowing, and periarticular osteophytes.  Given that and combined with his decreased mobility, the severity of his pain is decreased quality of life as well as the effect on his activities of daily living. He did wish to proceed with a total hip arthroplasty through direct anterior approach.  He understands the risks of acute blood loss, anemia, nerve and vessel injury, fracture, infection, dislocation, DVT. He understands the goals are decreased pain, improved mobility, and overall improved quality of life.  DESCRIPTION OF PROCEDURE:  After  informed was consent, appropriate left hip was marked.  He was brought to the operating room and general anesthesia was obtained while he was on the stretcher.  Traction boots were placed on both his feet.  Next, he was placed supine on the Hana fracture table with the perineal post in place and both legs in inline skeletal traction devices, but no traction applied.  His left operative hip was then prepped and draped with DuraPrep and sterile drapes.  A time-out was called to identify correct patient, correct left hip.  We then made an incision inferior and posterior to the anterior superior iliac spine and carried this slightly obliquely down the leg.  I dissected down to the tensor fascia lata muscle and found a big tensor fascia lata muscle.  We divided the tensor fascia longitudinally knife and then we will proceed with a direct anterior approach to the hip.  We identified, cauterized the lateral femoral circumflex vessels and then placed a Cobra retractor around the lateral neck and up underneath the rectus femoris, a Cobra retractor medially.  We cleaned the adipose tissue off the joint capsule and opened up the joint capsule an L type format.  Findings, significant effusion and periarticular osteophytes and sclerotic changes on the femoral head.  We placed the Cobra retractors within the joint capsule.  We then made  our femoral neck cut with an oscillating saw proximal to the lesser trochanter and completed this on osteotome.  A corkscrew guide was placed in the femoral head and the femoral head was removed in its entirety.  We then placed a bent Hohmann over the medial acetabulum.  I cleaned remnants of acetabular labrum from the acetabulum.  We also cleaned out the fovea.  I then began reaming under direct visualization from a size 42 reamer in 2 mm increments up to a size 52 with all reamers under direct visualization and the last reamer also on direct fluoroscopy, so I could  obtain our depth of reaming, our inclination, and anteversion.  Once I was pleased with this, I placed the real DePuy Sector Gription acetabular component size 52, the apex hole eliminator, and a single screw.  We then placed the real 36+ 4 neutral polyethylene liner for the size acetabular component.  Attention was then turned to the femur.  With the leg externally rotated to 100 degrees extended and adducted, we replaced a Mueller retractor medially and a Hohmann retractor behind the greater trochanter.  I released the lateral joint capsule and used a box cutting osteotome to enter femoral canal and a rongeur to lateralize, I began broaching from a size 8 broach up to a size 11.  Using the Corail femoral broaching system with a size 11, we trialed a standard neck and a 36+ 1.5 hip ball, reduced this back in the acetabulum, and I was pleased with this stability throughout his arc of motion even past 90 degrees of external rotation and 45 degrees of internal rotation.  There was minimal shuck.  His leg length and offset were measured near equal under fluoroscopy.  We then dislocated the hip and removed the trial components.  I then placed the real Corail femoral component size 11 with standard offset and the real 36+ 1.5 ceramic hip ball and reduced this back in the acetabulum and again it was stable.  We then copiously irrigated the soft tissue with normal saline solution.  I was able to close a small part of the joint capsule interrupted #1 Ethibond suture followed by running #1 Vicryl in the tensor fascia with 0 Vicryl in the deep tissue, 2-0 Vicryl in subcutaneous tissue, 4-0 Monocryl subcuticular stitch, and Steri-Strips on the skin.  An Aquacel dressing was applied.  He was then taken off the Hana table, awakened, extubated, and taken to the recovery room in stable condition.  All final counts were correct.  There were no complications noted.  Of note, Erskine Emery, P.A. assisted  the entire case and his assistance was crucial for facilitating all aspects of this case.     Lind Guest. Ninfa Linden, M.D.     CYB/MEDQ  D:  07/26/2014  T:  07/27/2014  Job:  [id number]

## 2014-07-27 NOTE — Progress Notes (Signed)
07/27/14 Spoke with patient about [facility]. He selected [facility]. Contacted Miranda with [facility] and set up [place]. Contacted Frank with [facility] and requested rolling walker and 3N1 be delivered to patient's room. Patient stated that he will have assistance at home after d/c. Will follow until discharge.

## 2014-07-27 NOTE — Evaluation (Signed)
Occupational Therapy Evaluation Patient Details Name: Vincent Holt MRN: [id number] DOB: [birth year] Today's Date: 07/27/2014    History of Present Illness [name], 49 y.o. male, has a history of pain and functional disability in the left hip(s) due to arthritis and patient has failed non-surgical conservative treatments for greater than 12 weeks to include NSAID's and/or analgesics and activity modification. Patient has evidence of subchondral sclerosis, periarticular osteophytes and joint space narrowing by imaging studies.  Pt underwent direct anterior approach THA on 07/26/2014.   Clinical Impression   Patient independent PTA. Patient currently functioning at an overall supervision>min assist level. Patient will benefit from acute OT to increase overall independence in the areas of ADLs, functional mobility, and overall safety in order to safely discharge home with intermittent supervision from daughter and son-in-law.     Follow Up Recommendations  No OT follow up;Supervision - Intermittent    Equipment Recommendations  3 in 1 bedside comode    Recommendations for Other Services  None at this time     Precautions / Restrictions Precautions Precautions: Anterior Hip Precaution Booklet Issued: No (direct approach) Precaution Comments: Direct Approach, No Precautions Restrictions Weight Bearing Restrictions: Yes LLE Weight Bearing: Weight bearing as tolerated      Mobility Bed Mobility - Per PT eval Overal bed mobility: Modified Independent  Transfers Overall transfer level: Needs assistance Equipment used: Rolling walker (2 wheeled) Transfers: Sit to/from Stand Sit to Stand: Supervision    Balance Overall balance assessment: Needs assistance Sitting-balance support: No upper extremity supported;Feet supported Sitting balance-Leahy Scale: Good     Standing balance support: Bilateral upper extremity supported;During functional activity Standing balance-Leahy  Scale: Fair Standing balance comment: Able to stand >5 minutes to  brush teeth, but needed U UE support     ADL Overall ADL's : Needs assistance/impaired Eating/Feeding: Sitting;Independent   Grooming: Supervision/safety;Standing   Upper Body Bathing: Set up;Sitting   Lower Body Bathing: Minimal assistance;Sit to/from stand;Cueing for safety   Upper Body Dressing : Set up;Sitting   Lower Body Dressing: Minimal assistance;Sit to/from stand;Cueing for safety   Toilet Transfer: Supervision/safety;BSC;RW;Ambulation   Toileting- Clothing Manipulation and Hygiene: Supervision/safety;Sit to/from stand       Functional mobility during ADLs: Supervision/safety;Rolling walker General ADL Comments: Patient overall supervision for functional mobility and transfers. Patient able to reach BLEs for LB ADLs with extra time and increased pain. Encouraged patient to work on stretching prior to next OT session. Patient safely performs sit<>stands during functional transfers with min verbal cues.     Pertinent Vitals/Pain Pain Assessment: 0-10 Pain Score: 8  Pain Location: LLE Pain Descriptors / Indicators: Operative site guarding Pain Intervention(s): Monitored during session;Repositioned     Hand Dominance Right   Extremity/Trunk Assessment Upper Extremity Assessment Upper Extremity Assessment: Overall WFL for tasks assessed   Lower Extremity Assessment Lower Extremity Assessment: LLE deficits/detail LLE Deficits / Details: Able to perform hip and knee flexion in bed with limited range, able to ambulate with rolling walker   Cervical / Trunk Assessment Cervical / Trunk Assessment: Normal   Communication Communication Communication: No difficulties   Cognition Arousal/Alertness: Awake/alert Behavior During Therapy: WFL for tasks assessed/performed Overall Cognitive Status: Within Functional Limits for tasks assessed              Home Living Family/patient expects to be  discharged to:: Private residence Living Arrangements: Children Available Help at Discharge: Other (Comment) (daughter and husband to live with patient for awhile) Type of Home: House Home Access: Stairs to  enter Entrance Stairs-Number of Steps: 1 Entrance Stairs-Rails: Left Home Layout: One level     Bathroom Shower/Tub: Walk-in shower;Door   [employer] Toilet: Standard     Home Equipment: None          Prior Functioning/Environment Level of Independence: Independent        Comments: Prior to surgery worked two jobs    OT Diagnosis: Generalized weakness;Acute pain   OT Problem List: Decreased strength;Decreased range of motion;Decreased activity tolerance;Impaired balance (sitting and/or standing);Decreased knowledge of use of DME or AE;Pain   OT Treatment/Interventions: Self-care/ADL training;Therapeutic exercise;Energy conservation;DME and/or AE instruction;Therapeutic activities;Patient/family education;Balance training    OT Goals(Current goals can be found in the care plan section) Acute Rehab OT Goals Patient Stated Goal: Return home OT Goal Formulation: With patient Time For Goal Achievement: 08/03/14 Potential to Achieve Goals: Good ADL Goals Pt Will Perform Grooming: with modified independence;standing Pt Will Perform Upper Body Bathing: Independently;sitting Pt Will Perform Lower Body Bathing: with modified independence;sit to/from stand Pt Will Perform Upper Body Dressing: Independently;sitting Pt Will Perform Lower Body Dressing: with modified independence;sit to/from stand Pt Will Transfer to Toilet: with modified independence;ambulating;bedside commode Pt Will Perform Toileting - Clothing Manipulation and hygiene: with modified independence;sit to/from stand Pt Will Perform Tub/Shower Transfer: Shower transfer;with modified independence;3 in 1;ambulating;rolling walker  OT Frequency: Min 2X/week   Barriers to D/C: None known at this time           End of Session Equipment Utilized During Treatment: Rolling walker  Activity Tolerance: Patient tolerated treatment well Patient left: in chair;with call bell/phone within reach   Time: 1884-1660 OT Time Calculation (min): 16 min Charges:  OT General Charges $OT Visit: 1 Procedure OT Evaluation $Initial OT Evaluation Tier I: 1 Procedure  Diasha Castleman , MS, OTR/L, CLT Pager: [phone]  07/27/2014, 11:20 AM

## 2014-07-28 ENCOUNTER — Encounter (HOSPITAL_COMMUNITY): Payer: Self-pay | Admitting: General Practice

## 2014-07-28 LAB — CBC
HEMATOCRIT: 35.8 % — AB (ref 39.0–52.0)
Hemoglobin: 11.5 g/dL — ABNORMAL LOW (ref 13.0–17.0)
MCH: 25.2 pg — ABNORMAL LOW (ref 26.0–34.0)
MCHC: 32.1 g/dL (ref 30.0–36.0)
MCV: 78.5 fL (ref 78.0–100.0)
PLATELETS: 190 10*3/uL (ref 150–400)
RBC: 4.56 MIL/uL (ref 4.22–5.81)
RDW: 14.2 % (ref 11.5–15.5)
WBC: 6.8 10*3/uL (ref 4.0–10.5)

## 2014-07-28 MED ORDER — OXYCODONE-ACETAMINOPHEN 5-325 MG PO TABS: 1.0000 | ORAL_TABLET | ORAL | Status: DC | PRN

## 2014-07-28 MED ORDER — DOCUSATE SODIUM 100 MG PO CAPS: 100.0000 mg | ORAL_CAPSULE | Freq: Two times a day (BID) | ORAL | Status: DC

## 2014-07-28 MED ORDER — METHOCARBAMOL 500 MG PO TABS: 500.0000 mg | ORAL_TABLET | Freq: Four times a day (QID) | ORAL | Status: DC | PRN

## 2014-07-28 MED ORDER — ASPIRIN 325 MG PO TBEC: 325.0000 mg | DELAYED_RELEASE_TABLET | Freq: Two times a day (BID) | ORAL | Status: DC

## 2014-07-28 NOTE — Progress Notes (Signed)
Occupational Therapy Treatment Patient Details Name: Vincent Holt MRN: [id number] DOB: 08/12/1965 Today's Date: 07/28/2014    History of present illness Vincent Holt, 48 y.o. male, has a history of pain and functional disability in the left hip(s) due to arthritis and patient has failed non-surgical conservative treatments for greater than 12 weeks to include NSAID's and/or analgesics and activity modification. Patient has evidence of subchondral sclerosis, periarticular osteophytes and joint space narrowing by imaging studies.  Pt underwent direct anterior approach THA on 07/26/2014.   OT comments  Patient overall mod I, discharging from OT acute services at this time.   Follow Up Recommendations  No OT follow up;Supervision - Intermittent    Equipment Recommendations  3 in 1 bedside comode    Recommendations for Other Services  None at this time    Precautions / Restrictions Precautions Precautions: None Precaution Booklet Issued: No Precaution Comments: Direct Approach, No Precautions Restrictions Weight Bearing Restrictions: Yes LLE Weight Bearing: Weight bearing as tolerated       Mobility Bed Mobility Overal bed mobility: Modified Independent  Transfers Overall transfer level: Modified independent    Balance Overall balance assessment: No apparent balance deficits (not formally assessed)    ADL General ADL Comments: Patient ambulated from room <> gym and practiced simulated walk-in shower transfer using simulated shower block, BSC, and RW. Patient mod I for this and shows good safety awareness and technique. Patient to be discharged from acute OT.      Cognition   Behavior During Therapy: WFL for tasks assessed/performed Overall Cognitive Status: Within Functional Limits for tasks assessed                 Pertinent Vitals/ Pain       Pain Assessment: Faces Faces Pain Scale: Hurts a little bit Pain Location: right hip "stiff/stretching pain" Pain  Descriptors / Indicators: Grimacing;Discomfort Pain Intervention(s): Monitored during session         Frequency Min 2X/week     Progress Toward Goals  OT Goals(current goals can now be found in the care plan section)  Progress towards OT goals: Goals met/education completed, patient discharged from OT     Plan Discharge plan remains appropriate       End of Session Equipment Utilized During Treatment: Rolling walker   Activity Tolerance Patient tolerated treatment well   Patient Left in chair;with call bell/phone within reach    Time: 0901-0912 OT Time Calculation (min): 11 min  Charges: OT General Charges $OT Visit: 1 Procedure OT Treatments $Self Care/Home Management : 8-22 mins  CLAY,PATRICIA , MS, OTR/L, CLT Pager: [phone]  07/28/2014, 9:21 AM    

## 2014-07-28 NOTE — Progress Notes (Signed)
Subjective: 2 Days Post-Op Procedure(s) (LRB): LEFT TOTAL HIP ARTHROPLASTY ANTERIOR APPROACH (Left) Patient reports pain as moderate.  Walking in hallway with PT this morning. No complaints.    Objective: Vital signs in last 24 hours: Temp:  [97.4 F (36.3 C)-99.4 F (37.4 C)] 99.4 F (37.4 C) (03/10 0458) Pulse Rate:  [70-78] 75 (03/10 0458) Resp:  [16-18] 16 (03/10 0458) BP: (116-129)/(62-71) 116/62 mmHg (03/10 0458) SpO2:  [96 %-100 %] 96 % (03/10 0458)  Intake/Output from previous day: 03/09 0701 - 03/10 0700 In: 820 [P.O.:820] Out: 2400 [Urine:2400] Intake/Output this shift:     Recent Labs  07/27/14 0615 07/28/14 0750  HGB 11.1* 11.5*    Recent Labs  07/27/14 0615 07/28/14 0750  WBC 5.9 6.8  RBC 4.42 4.56  HCT 34.5* 35.8*  PLT 176 190    Recent Labs  07/27/14 0615  NA 134*  K 3.7  CL 98  CO2 28  BUN 13  CREATININE 1.14  GLUCOSE 122*  CALCIUM 8.9   No results for input(s): LABPT, INR in the last 72 hours.  Left lower extremity:  Sensation intact distally Intact pulses distally Dorsiflexion/Plantar flexion intact Incision: dressing C/D/I Compartment soft  Assessment/Plan: 2 Days Post-Op Procedure(s) (LRB): LEFT TOTAL HIP ARTHROPLASTY ANTERIOR APPROACH (Left) Discharge home with home health  Follow up with Dr. Ninfa Linden at 2 weeks post-op  Erskine Emery 07/28/2014, 9:50 AM

## 2014-07-28 NOTE — Progress Notes (Signed)
Physical Therapy Treatment Patient Details Name: Vincent Holt MRN: [id number] DOB: 07-01-1965 Today's Date: 07/28/2014    History of Present Illness Vincent Holt, 49 y.o. male, has a history of pain and functional disability in the left hip(s) due to arthritis and patient has failed non-surgical conservative treatments for greater than 12 weeks to include NSAID's and/or analgesics and activity modification. Patient has evidence of subchondral sclerosis, periarticular osteophytes and joint space narrowing by imaging studies.  Pt underwent direct anterior approach THA on 07/26/2014.    PT Comments    Vincent Holt presented standing with rolling walker independently (with CNA in room), and was able to navigate to the bathroom and use the bathroom independently.  Pt demonstrated knowledge of safe ambulation, transfers and ambulation of stairs.  Pt performed step through gait pattern with rolling walker, with normal stride length and only min forward lean/WB on UE. Continue to with therapy PRN to address any remaining functional deficits.    Follow Up Recommendations  Home health PT     Equipment Recommendations  Rolling walker with 5" wheels    Recommendations for Other Services       Precautions / Restrictions Precautions Precautions: None Precaution Booklet Issued: No Precaution Comments: Direct Approach, No Precautions Restrictions Weight Bearing Restrictions: Yes LLE Weight Bearing: Weight bearing as tolerated    Mobility  Bed Mobility Overal bed mobility: Modified Independent                Transfers Overall transfer level: Modified independent Equipment used: Rolling walker (2 wheeled) Transfers: Sit to/from Stand           General transfer comment: demonstrated understanding of hand placement for sit to stand transfer  Ambulation/Gait Ambulation/Gait assistance: Modified independent (Device/Increase time) Ambulation Distance (Feet): 525 Feet Assistive device:  Rolling walker (2 wheeled) Gait Pattern/deviations: Step-through pattern (step length WNL, dec WB through hands, slight forward lean) Gait velocity: Still minimally slow/guarded       Stairs Stairs: Yes Stairs assistance: Modified independent (Device/Increase time) Stair Management: One rail Left Number of Stairs: 4 General stair comments: Able to demonstrate understanding of safe stair ascent/descent, able to safely and independently manage stairs  Wheelchair Mobility    Modified Rankin (Stroke Patients Only)       Balance Overall balance assessment: No apparent balance deficits (not formally assessed)         Standing balance support: Single extremity supported Standing balance-Leahy Scale: Good Standing balance comment: Still uses walker bilaterally, but demonstrated increased standing endurance and decreased UE WB                    Cognition Arousal/Alertness: Awake/alert Behavior During Therapy: WFL for tasks assessed/performed Overall Cognitive Status: Within Functional Limits for tasks assessed                      Exercises Total Joint Exercises Ankle Circles/Pumps: AROM;10 reps;Seated Quad Sets: 10 reps;AROM;Seated Short Arc Quad: 10 reps;AROM;Seated Heel Slides: 10 reps;AROM;Seated (Instructed on standing with UE support for HEP) Hip ABduction/ADduction:  (Instructed in HEP) Long Arc Quad: 10 reps;AROM;Seated Marching in Standing: Seated;AROM;10 reps (Instructed in standing with UE support for HEP) Standing Hip Extension:  (Instructed on standing with UE for HEP)    General Comments        Pertinent Vitals/Pain Pain Assessment: 0-10 Pain Score: 6  Faces Pain Scale: Hurts a little bit Pain Location: R hip Pain Descriptors / Indicators: Discomfort Pain Intervention(s): Monitored during session  Home Living                      Prior Function            PT Goals (current goals can now be found in the care plan  section) Acute Rehab PT Goals Patient Stated Goal: Return home Progress towards PT goals: Progressing toward goals    Frequency  7X/week    PT Plan Current plan remains appropriate    Co-evaluation             End of Session   Activity Tolerance: Patient tolerated treatment well Patient left: in chair;with call bell/phone within reach     Time: 0923-0952 PT Time Calculation (min) (ACUTE ONLY): 29 min  Charges:  $Gait Training: 8-22 mins $Self Care/Home Management: 8-22                    G Codes:      Vincent Holt Aug 06, 2014, 11:09 AM  Vincent Holt, SPT (student physical therapist) Office phone: [phone]

## 2014-07-28 NOTE — Discharge Instructions (Signed)
Pickup stool softener for constipation. Left lower extremity Weight Bearing as tolerated  No  Left hip precautions Progress activities slowly Expect left hip and possible left knee soreness and swelling. Apply heat or ice as needed. Keep left hip dressing clean dry and intact, may shower with dressing intact.

## 2014-07-28 NOTE — Progress Notes (Signed)
Occupational Therapy Discharge Patient Details Name: Vincent Holt MRN: [id number] DOB: March 12, 1966 Today's Date: 07/28/2014  Patient discharged from OT services secondary to goals met and no further OT needs identified.  Please see latest therapy progress note for current level of functioning and progress toward goals.    Progress and discharge plan discussed with patient and/or caregiver: Patient/Caregiver agrees with plan   Please re-order OT if needed. Thanks.    Twanisha Foulk , MS, OTR/L, CLT Pager: [phone]  07/28/2014, 9:23 AM

## 2014-07-28 NOTE — Discharge Summary (Signed)
Patient ID: Vincent Holt MRN: [id number] DOB/AGE: Dec 24, 1965 49 y.o.  Admit date: 07/26/2014 Discharge date: 07/28/2014  Admission Diagnoses:  Principal Problem:   Osteoarthritis of left hip Active Problems:   Status post total replacement of left hip   Discharge Diagnoses:  Same  Past Medical History  Diagnosis Date  . Anemia   . Hypertension     takes Bisoprolol-HCTZ daily  . Heart murmur     was told yrs ago  . Pneumonia     hx of-80's  . Headache   . Arthritis   . Joint pain   . Joint swelling   . Osteoarthritis   . Diverticulosis     Surgeries: Procedure(s): LEFT TOTAL HIP ARTHROPLASTY ANTERIOR APPROACH on 07/26/2014   Consultants:  PT/OT  Discharged Condition: Improved  Hospital Course: Vincent Holt is an 49 y.o. male who was admitted 07/26/2014 for operative treatment ofOsteoarthritis of left hip. Patient has severe unremitting pain that affects sleep, daily activities, and work/hobbies. After pre-op clearance the patient was taken to the operating room on 07/26/2014 and underwent  Procedure(s): LEFT TOTAL HIP ARTHROPLASTY ANTERIOR APPROACH.    Patient was given perioperative antibiotics: Anti-infectives    Start     Dose/Rate Route Frequency Ordered Stop   07/26/14 1900  ceFAZolin (ANCEF) IVPB 1 g/50 mL premix     1 g 100 mL/hr over 30 Minutes Intravenous Every 6 hours 07/26/14 1815 07/27/14 0221   07/26/14 0600  ceFAZolin (ANCEF) IVPB 2 g/50 mL premix     2 g 100 mL/hr over 30 Minutes Intravenous On call to O.R. 07/25/14 1328 07/26/14 1325       Patient was given sequential compression devices, early ambulation, and chemoprophylaxis to prevent DVT.  Patient benefited maximally from hospital stay and there were no complications.    Recent vital signs: Patient Vitals for the past 24 hrs:  BP Temp Temp src Pulse Resp SpO2  07/28/14 0458 116/62 mmHg 99.4 F (37.4 C) Oral 75 16 96 %  07/27/14 2224 - 99.3 F (37.4 C) Oral - - -  07/27/14 2100 129/69  mmHg 97.4 F (36.3 C) Axillary 76 - 99 %  07/27/14 1100 116/71 mmHg 99.2 F (37.3 C) Oral 78 18 100 %  07/27/14 1030 120/66 mmHg 97.4 F (36.3 C) Oral 70 16 100 %     Recent laboratory studies:  Recent Labs  07/27/14 0615 07/28/14 0750  WBC 5.9 6.8  HGB 11.1* 11.5*  HCT 34.5* 35.8*  PLT 176 190  NA 134*  --   K 3.7  --   CL 98  --   CO2 28  --   BUN 13  --   CREATININE 1.14  --   GLUCOSE 122*  --   CALCIUM 8.9  --      Discharge Medications:     Medication List    TAKE these medications        aspirin 325 MG EC tablet  Take 1 tablet (325 mg total) by mouth 2 (two) times daily after a meal.     bisoprolol-hydrochlorothiazide 2.5-6.25 MG per tablet  Commonly known as:  ZIAC  Take 2 tablets by mouth daily.     docusate sodium 100 MG capsule  Commonly known as:  COLACE  Take 1 capsule (100 mg total) by mouth 2 (two) times daily.     methocarbamol 500 MG tablet  Commonly known as:  ROBAXIN  Take 1 tablet (500 mg total) by mouth every 6 (six) hours  as needed for muscle spasms.     oxyCODONE-acetaminophen 5-325 MG per tablet  Commonly known as:  ROXICET  Take 1-2 tablets by mouth every 4 (four) hours as needed for severe pain.     triamcinolone cream 0.1 %  Commonly known as:  KENALOG  Apply 1 application topically daily as needed (irritation).     XOLAIR [place]  Inject 1 each into the skin every 30 (thirty) days.        Diagnostic Studies: Dg Hip Port Unilat With Pelvis 1v Left  07/26/2014   CLINICAL DATA:  Hip replacement surgery  EXAM: LEFT HIP (WITH PELVIS) 3 VIEW PORTABLE  COMPARISON:  MR 02/04/2014  FINDINGS: Components of left hip arthroplasty project in expected location. No fracture or dislocation. Mild cartilage narrowing noted in the right hip.  IMPRESSION: 1. Left hip arthroplasty without fracture or other apparent complication.   Electronically Signed   By: Lucrezia Europe M.D.   On: 07/26/2014 16:40   Dg Hip Operative Unilat With Pelvis Left  07/26/2014    CLINICAL DATA:  LEFT hip replacement  EXAM: OPERATIVE LEFT HIP (WITH PELVIS IF PERFORMED) 2 VIEWS  TECHNIQUE: Fluoroscopic spot image(s) were submitted for interpretation post-operatively.  COMPARISON:  None  FLUOROSCOPY TIME:  0 min 34 seconds  FINDINGS: Acetabular and femoral components of a LEFT hip prosthesis are identified.  No fracture or dislocation seen.  Visualized osseous structures otherwise normal.  IMPRESSION: LEFT hip prosthesis without acute complication.   Electronically Signed   By: Lavonia Dana M.D.   On: 07/26/2014 15:07    Disposition: Discharge to home      Discharge Instructions    Discharge wound care:    Complete by:  As directed   Keep dressing clean and intact. May shower with dressing intact.     Weight bearing as tolerated    Complete by:  As directed   Laterality:  left  Extremity:  Lower           Follow-up Information    Follow up with [facility].   Why:  They will contact you to schedule home therapy visits.    Contact information:   [address] [place] [address] [phone]       Follow up with Mcarthur Rossetti, MD. Schedule an appointment as soon as possible for a visit in 2 weeks.   Specialty:  Orthopedic Surgery   Contact information:   [place] [address] [phone]        Signed: Erskine Emery 07/28/2014, 9:43 AM

## 2015-01-18 DIAGNOSIS — I10 Essential (primary) hypertension: Secondary | ICD-10-CM

## 2015-01-18 DIAGNOSIS — T783XXA Angioneurotic edema, initial encounter: Secondary | ICD-10-CM | POA: Insufficient documentation

## 2015-01-18 DIAGNOSIS — L508 Other urticaria: Secondary | ICD-10-CM

## 2015-01-27 ENCOUNTER — Other Ambulatory Visit: Payer: Self-pay | Admitting: *Deleted

## 2015-01-27 MED ORDER — OMALIZUMAB 150 MG ~~LOC~~ SOLR
150.0000 mg | SUBCUTANEOUS | Status: DC
  Administered 2015-02-23 – 2016-05-16 (×14): 150 mg via SUBCUTANEOUS

## 2015-02-23 ENCOUNTER — Ambulatory Visit (INDEPENDENT_AMBULATORY_CARE_PROVIDER_SITE_OTHER): Payer: Managed Care, Other (non HMO) | Admitting: *Deleted

## 2015-02-23 DIAGNOSIS — L501 Idiopathic urticaria: Secondary | ICD-10-CM | POA: Diagnosis not present

## 2015-02-23 DIAGNOSIS — L508 Other urticaria: Secondary | ICD-10-CM

## 2015-03-23 ENCOUNTER — Ambulatory Visit (INDEPENDENT_AMBULATORY_CARE_PROVIDER_SITE_OTHER): Payer: Managed Care, Other (non HMO) | Admitting: *Deleted

## 2015-03-23 DIAGNOSIS — L508 Other urticaria: Secondary | ICD-10-CM

## 2015-04-20 ENCOUNTER — Ambulatory Visit (INDEPENDENT_AMBULATORY_CARE_PROVIDER_SITE_OTHER): Payer: Managed Care, Other (non HMO)

## 2015-04-20 DIAGNOSIS — L508 Other urticaria: Secondary | ICD-10-CM

## 2015-04-20 DIAGNOSIS — L501 Idiopathic urticaria: Secondary | ICD-10-CM

## 2015-05-18 ENCOUNTER — Ambulatory Visit (INDEPENDENT_AMBULATORY_CARE_PROVIDER_SITE_OTHER): Payer: Managed Care, Other (non HMO) | Admitting: *Deleted

## 2015-05-18 DIAGNOSIS — L501 Idiopathic urticaria: Secondary | ICD-10-CM | POA: Diagnosis not present

## 2015-05-18 NOTE — Progress Notes (Signed)
This encounter was created in error - please disregard.

## 2015-06-15 ENCOUNTER — Ambulatory Visit (INDEPENDENT_AMBULATORY_CARE_PROVIDER_SITE_OTHER): Payer: 59 | Admitting: *Deleted

## 2015-06-15 DIAGNOSIS — L501 Idiopathic urticaria: Secondary | ICD-10-CM | POA: Diagnosis not present

## 2015-07-13 ENCOUNTER — Ambulatory Visit (INDEPENDENT_AMBULATORY_CARE_PROVIDER_SITE_OTHER): Payer: 59 | Admitting: *Deleted

## 2015-07-13 DIAGNOSIS — L501 Idiopathic urticaria: Secondary | ICD-10-CM

## 2015-08-10 ENCOUNTER — Ambulatory Visit (INDEPENDENT_AMBULATORY_CARE_PROVIDER_SITE_OTHER): Payer: 59 | Admitting: *Deleted

## 2015-08-10 DIAGNOSIS — L501 Idiopathic urticaria: Secondary | ICD-10-CM

## 2015-09-07 ENCOUNTER — Ambulatory Visit (INDEPENDENT_AMBULATORY_CARE_PROVIDER_SITE_OTHER): Payer: 59 | Admitting: *Deleted

## 2015-09-07 DIAGNOSIS — L501 Idiopathic urticaria: Secondary | ICD-10-CM | POA: Diagnosis not present

## 2015-10-05 ENCOUNTER — Ambulatory Visit (INDEPENDENT_AMBULATORY_CARE_PROVIDER_SITE_OTHER): Payer: 59 | Admitting: *Deleted

## 2015-10-05 DIAGNOSIS — L501 Idiopathic urticaria: Secondary | ICD-10-CM | POA: Diagnosis not present

## 2015-12-25 ENCOUNTER — Ambulatory Visit (INDEPENDENT_AMBULATORY_CARE_PROVIDER_SITE_OTHER): Payer: 59 | Admitting: *Deleted

## 2015-12-25 DIAGNOSIS — L501 Idiopathic urticaria: Secondary | ICD-10-CM

## 2016-01-24 ENCOUNTER — Ambulatory Visit (INDEPENDENT_AMBULATORY_CARE_PROVIDER_SITE_OTHER): Payer: 59 | Admitting: *Deleted

## 2016-01-24 DIAGNOSIS — L501 Idiopathic urticaria: Secondary | ICD-10-CM | POA: Diagnosis not present

## 2016-02-21 ENCOUNTER — Ambulatory Visit (INDEPENDENT_AMBULATORY_CARE_PROVIDER_SITE_OTHER): Payer: 59

## 2016-02-21 DIAGNOSIS — L501 Idiopathic urticaria: Secondary | ICD-10-CM

## 2016-02-27 IMAGING — RF DG HIP (WITH PELVIS) OPERATIVE*L*
1 series · 2 of 2 positions shown · non-contrast
Comparison: None

FLUOROSCOPY TIME:  0 min 34 seconds

CLINICAL DATA: LEFT hip replacement

EXAM:
OPERATIVE LEFT HIP (WITH PELVIS IF PERFORMED) 2 VIEWS
TECHNIQUE: Fluoroscopic spot image(s) were submitted for interpretation
post-operatively.

[Series 1: run · 2 of 2 slices shown]
[im 1/2]
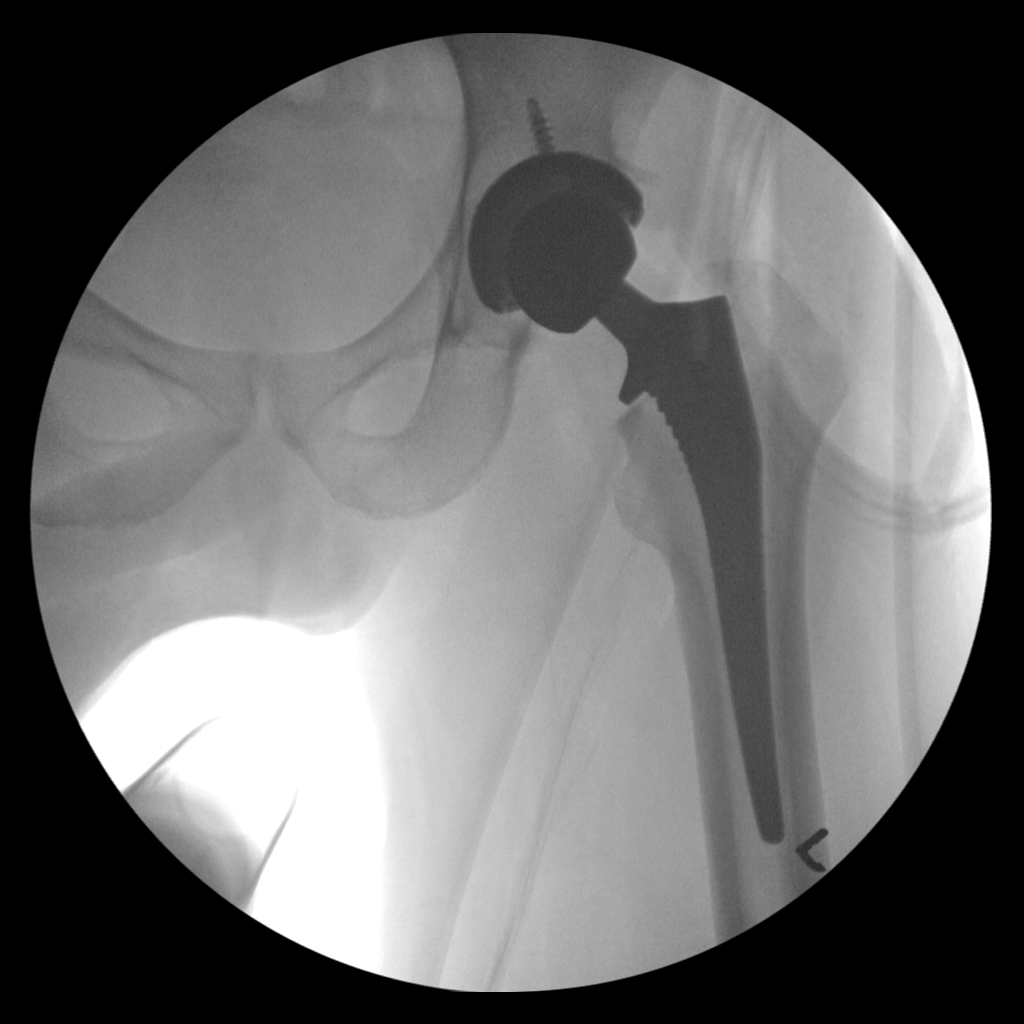
[im 2/2]
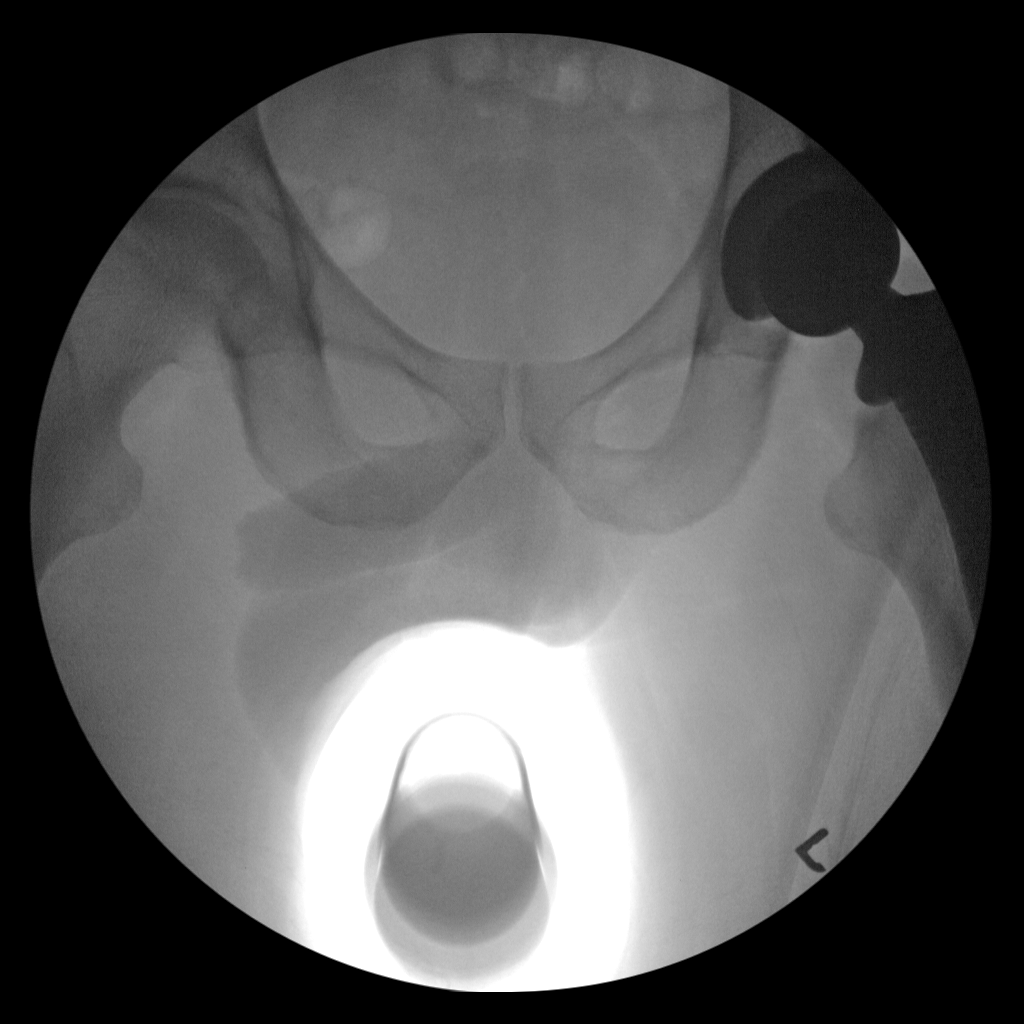

[2 of 2 positions shown; findings below may reference images not displayed]

FINDINGS: Acetabular and femoral components of a LEFT hip prosthesis are
identified.

No fracture or dislocation seen.

Visualized osseous structures otherwise normal.
IMPRESSION: LEFT hip prosthesis without acute complication.

## 2016-02-27 IMAGING — CR DG HIP (WITH OR WITHOUT PELVIS) 1V PORT*L*
3 series · 3 of 3 positions shown · non-contrast
Comparison: MR 02/04/2014

CLINICAL DATA: Hip replacement surgery

EXAM:
LEFT HIP (WITH PELVIS) 3 VIEW PORTABLE

[lateral (1 of 2)]
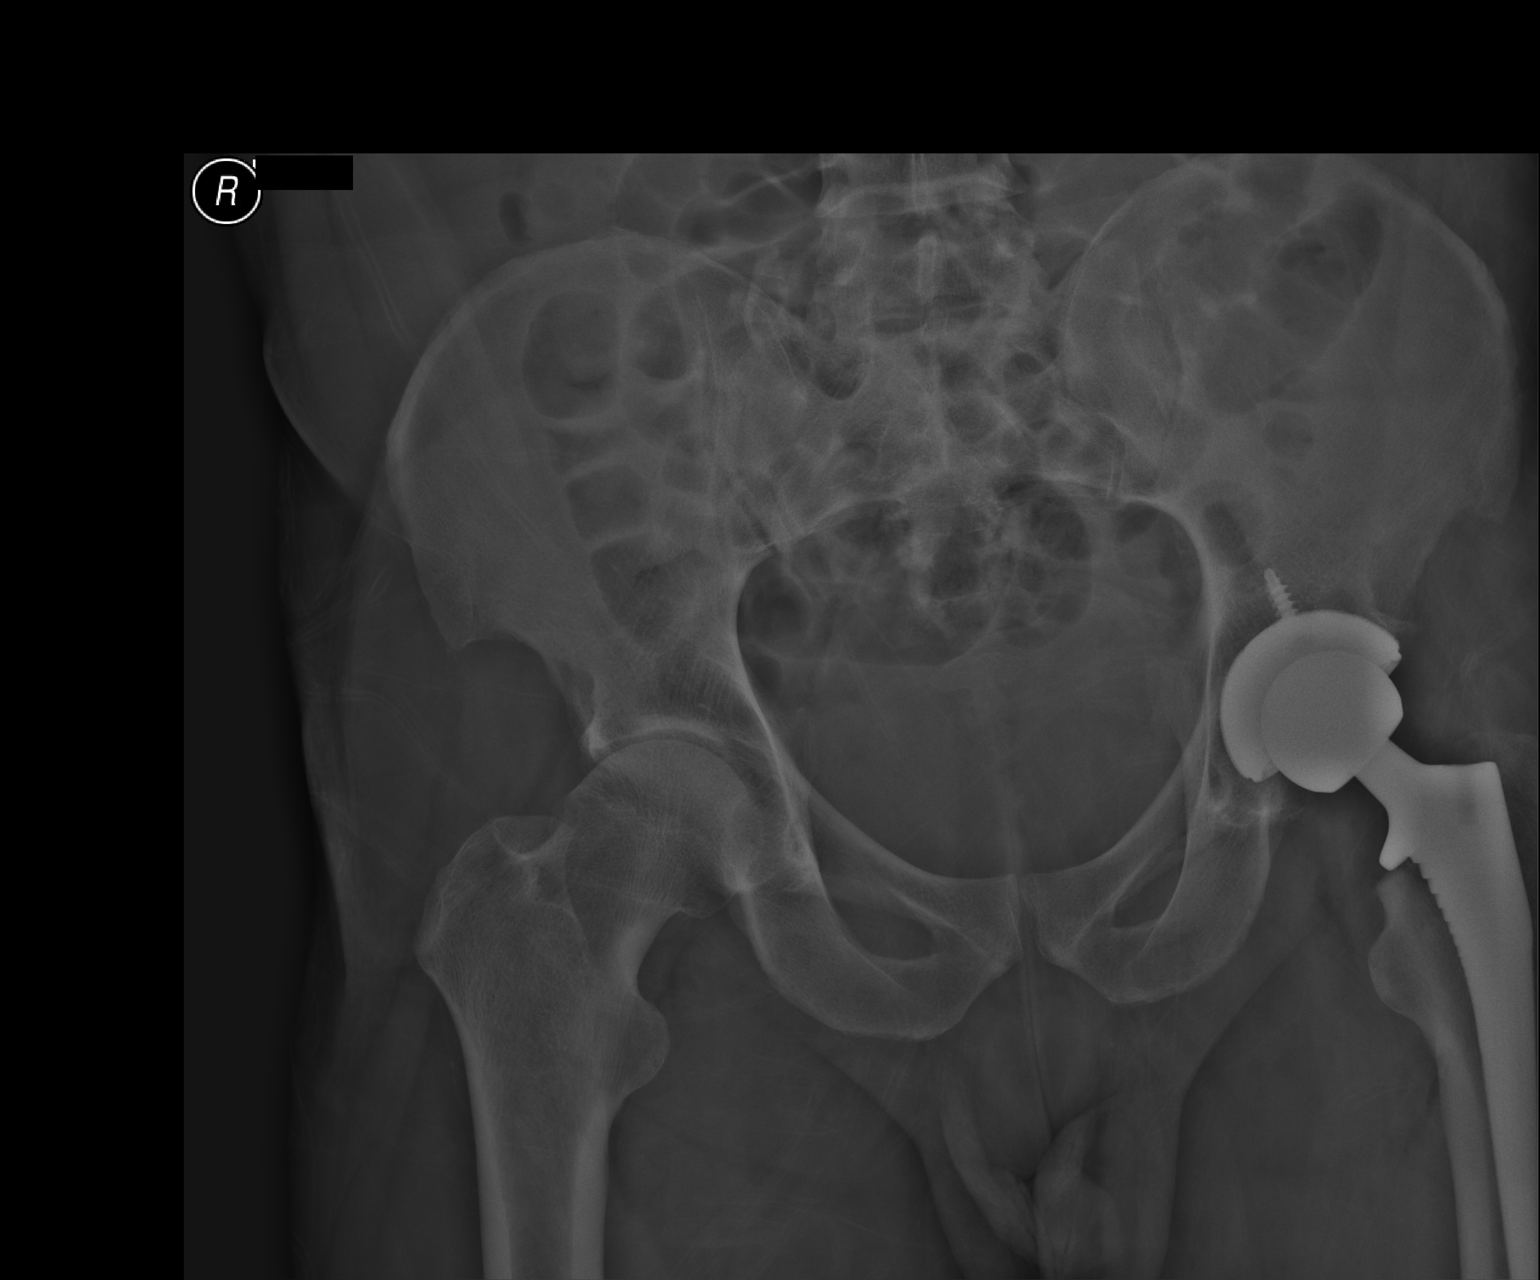

[AP]
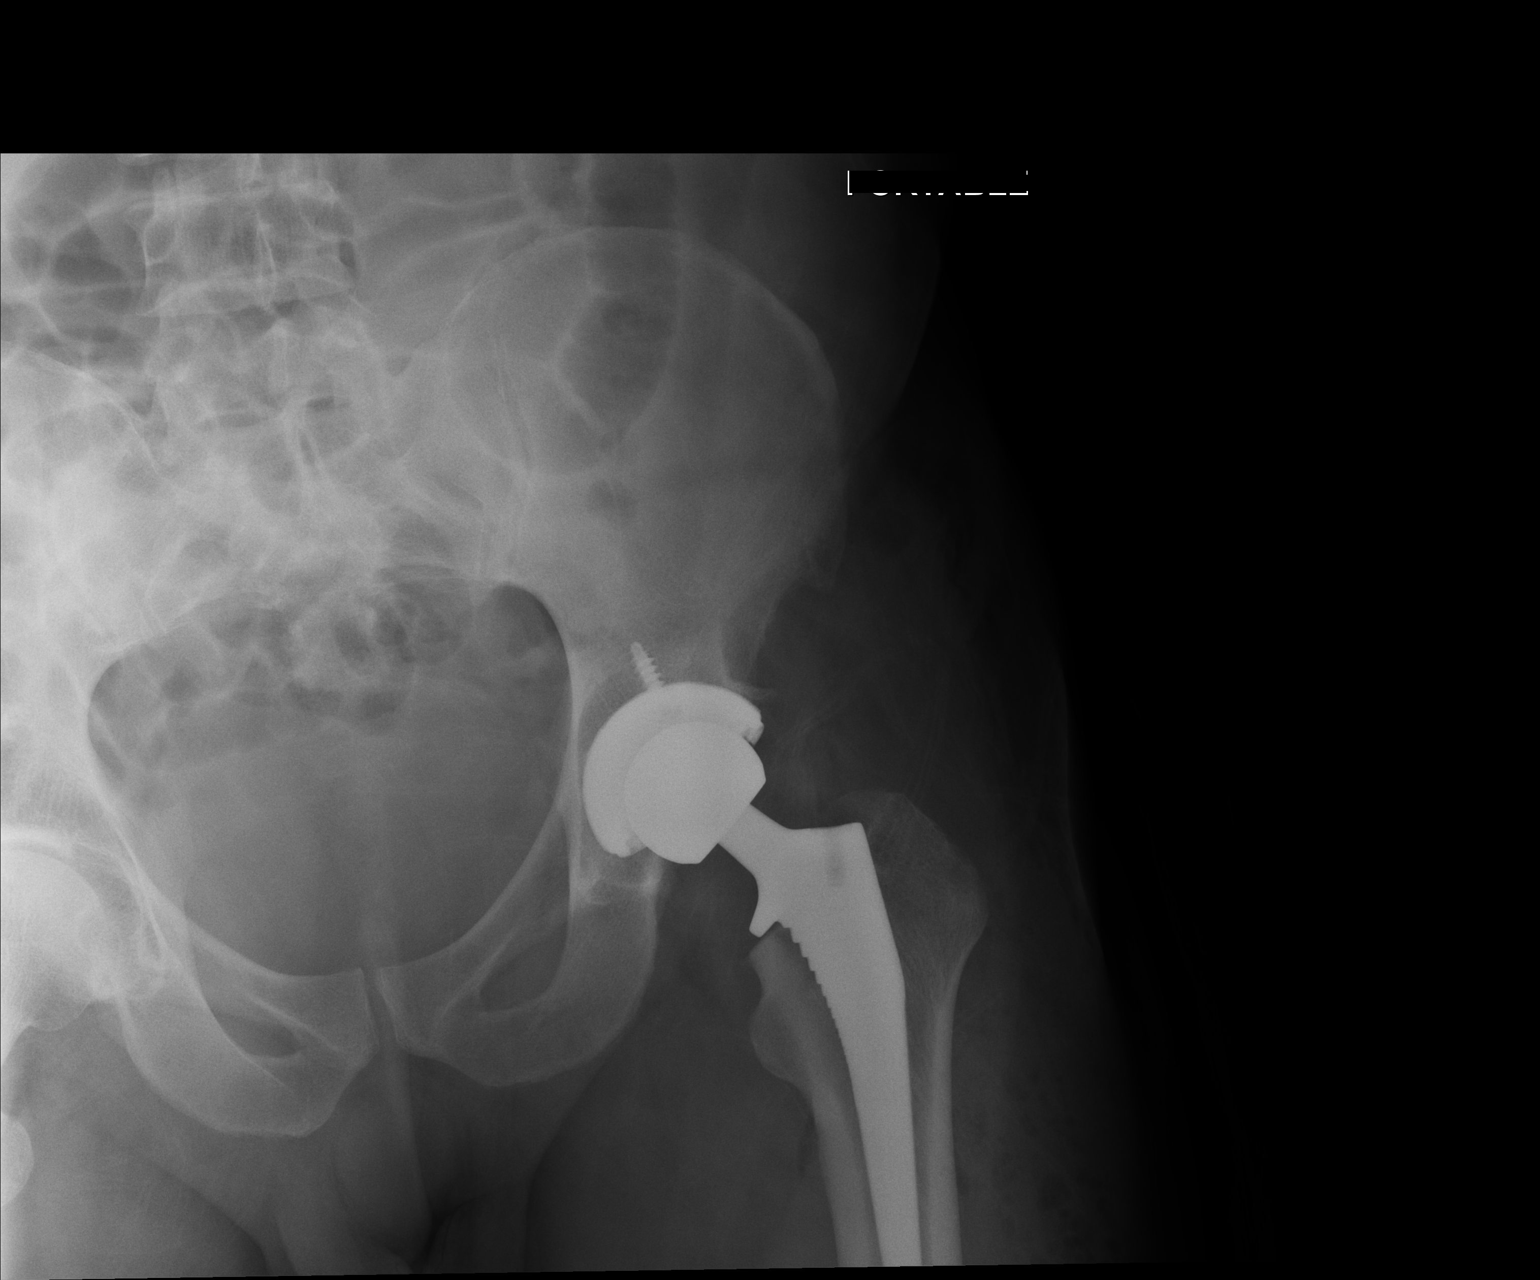

[lateral (2 of 2)]
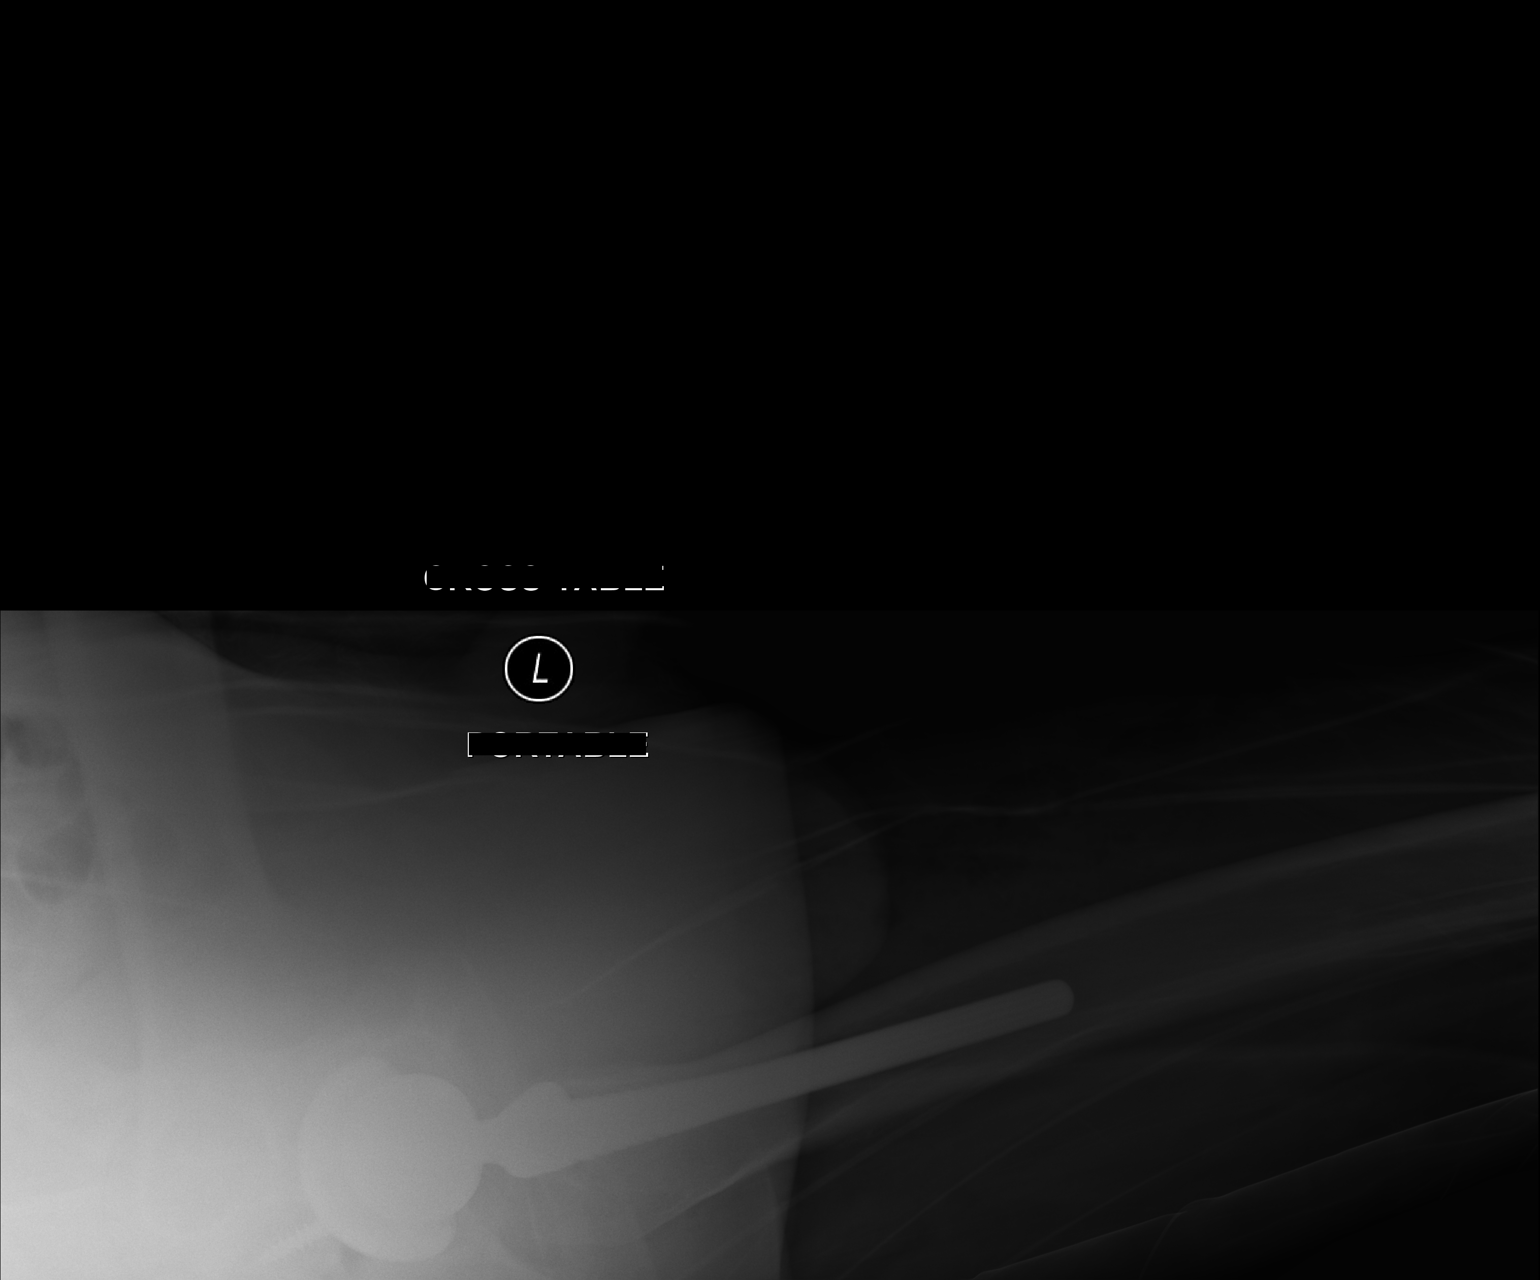

[3 of 3 positions shown; findings below may reference images not displayed]

FINDINGS: Components of left hip arthroplasty project in expected location. No
fracture or dislocation. Mild cartilage narrowing noted in the right
hip.
IMPRESSION: 1. Left hip arthroplasty without fracture or other apparent
complication.

## 2016-03-20 ENCOUNTER — Ambulatory Visit (INDEPENDENT_AMBULATORY_CARE_PROVIDER_SITE_OTHER): Payer: 59 | Admitting: *Deleted

## 2016-03-20 DIAGNOSIS — L501 Idiopathic urticaria: Secondary | ICD-10-CM

## 2016-04-18 ENCOUNTER — Ambulatory Visit (INDEPENDENT_AMBULATORY_CARE_PROVIDER_SITE_OTHER): Payer: 59 | Admitting: *Deleted

## 2016-04-18 DIAGNOSIS — L501 Idiopathic urticaria: Secondary | ICD-10-CM | POA: Diagnosis not present

## 2016-05-16 ENCOUNTER — Ambulatory Visit (INDEPENDENT_AMBULATORY_CARE_PROVIDER_SITE_OTHER): Payer: 59 | Admitting: *Deleted

## 2016-05-16 DIAGNOSIS — L501 Idiopathic urticaria: Secondary | ICD-10-CM

## 2016-05-29 ENCOUNTER — Encounter: Payer: Self-pay | Admitting: Allergy and Immunology

## 2016-05-29 ENCOUNTER — Ambulatory Visit (INDEPENDENT_AMBULATORY_CARE_PROVIDER_SITE_OTHER): Payer: Managed Care, Other (non HMO) | Admitting: Allergy and Immunology

## 2016-05-29 VITALS — BP 132/82 | HR 72 | Resp 22 | Ht 66.93 in | Wt 188.8 lb

## 2016-05-29 DIAGNOSIS — L501 Idiopathic urticaria: Secondary | ICD-10-CM

## 2016-05-29 MED ORDER — EPINEPHRINE 0.3 MG/0.3ML IJ SOAJ: 0.3000 mg | Freq: Once | INTRAMUSCULAR | 3 refills | Status: AC

## 2016-05-29 NOTE — Patient Instructions (Addendum)
  1. Discontinue Xolair administration  2. Auvi-Q 3.0, Benadryl, M.D./ER evaluation for allergic reaction  3. Further treatment?

## 2016-05-29 NOTE — Progress Notes (Signed)
Follow-up Note  Referring Provider: Maris Berger, MD Primary Provider: Maris Berger, MD Date of Office Visit: 05/29/2016  Subjective:   Vincent Holt (DOB: [birth year]) is a 51 y.o. male who returns to the [facility] on 05/[facility]/2016 in re-evaluation of the following:  HPI: [name] presents to this clinic in evaluation of his episodes of urticaria and angioedema that have been under excellent control with Xolair over the course of the past 3 years. While utilizing this medication he has had no problems at all and does not require any additional medications and has not used an EpiPen.  Allergies as of 05/29/2016   No Known Allergies     Medication List      bisoprolol-hydrochlorothiazide 2.5-6.25 MG tablet Commonly known as:  ZIAC Take 2 tablets by mouth daily.   EPIPEN 2-PAK 0.3 mg/0.3 mL Soaj injection Generic drug:  EPINEPHrine Inject into the muscle once.   EPINEPHrine 0.3 mg/0.3 mL Soaj injection Commonly known as:  AUVI-Q Inject 0.3 mLs (0.3 mg total) into the muscle once.   hydrochlorothiazide 25 MG tablet Commonly known as:  HYDRODIURIL Take 25 mg by mouth.   metoprolol succinate 100 MG 24 hr tablet Commonly known as:  TOPROL-XL Take 100 mg by mouth.   triamcinolone cream 0.1 % Commonly known as:  KENALOG Apply 1 application topically daily as needed (irritation).   valsartan 80 MG tablet Commonly known as:  DIOVAN Take 80 mg by mouth.   XOLAIR [place] Inject 150 vials into the skin every 30 (thirty) days.       Past Medical History:  Diagnosis Date  . Anemia   . Angio-edema   . Arthritis   . Diverticulosis   . Headache   . Heart murmur    was told yrs ago  . Hypertension    takes Bisoprolol-HCTZ daily  . Joint pain   . Joint swelling   . Osteoarthritis   . Pneumonia    hx of-80's  . Urticaria     Past Surgical History:  Procedure Laterality Date  . achiles tendon surgery Left   . COLONOSCOPY    .  ESOPHAGOGASTRODUODENOSCOPY    . TOTAL HIP ARTHROPLASTY Left 07/26/2014  . TOTAL HIP ARTHROPLASTY Left 07/26/2014   Procedure: LEFT TOTAL HIP ARTHROPLASTY ANTERIOR APPROACH;  Surgeon: Mcarthur Rossetti, MD;  Location: [facility];  Service: Orthopedics;  Laterality: Left;    Review of systems negative except as noted in HPI / PMHx or noted below:  Review of Systems  Constitutional: Negative.   HENT: Negative.   Eyes: Negative.   Respiratory: Negative.   Cardiovascular: Negative.   Gastrointestinal: Negative.   Genitourinary: Negative.   Musculoskeletal: Negative.   Skin: Negative.   Neurological: Negative.   Endo/Heme/Allergies: Negative.   Psychiatric/Behavioral: Negative.      Objective:   Vitals:   05/29/16 1522  BP: 132/82  Pulse: 72  Resp: (!) 22   Height: 5' 6.93" (170 cm)  Weight: 188 lb 12.8 oz (85.6 kg)   Physical Exam  Constitutional: He is well-developed, well-nourished, and in no distress.  HENT:  Head: Normocephalic.  Right Ear: Tympanic membrane, external ear and ear canal normal.  Left Ear: Tympanic membrane, external ear and ear canal normal.  Nose: Nose normal. No mucosal edema or rhinorrhea.  Mouth/Throat: Uvula is midline, oropharynx is clear and moist and mucous membranes are normal. No oropharyngeal exudate.  Eyes: Conjunctivae are normal.  Neck: Trachea normal. No tracheal tenderness present. No  tracheal deviation present. No thyromegaly present.  Cardiovascular: Normal rate, regular rhythm, S1 normal, S2 normal and normal heart sounds.   No murmur heard. Pulmonary/Chest: Breath sounds normal. No stridor. No respiratory distress. He has no wheezes. He has no rales.  Musculoskeletal: He exhibits no edema.  Lymphadenopathy:       Head (right side): No tonsillar adenopathy present.       Head (left side): No tonsillar adenopathy present.    He has no cervical adenopathy.  Neurological: He is alert. Gait normal.  Skin: No rash noted. He is not  diaphoretic. No erythema. Nails show no clubbing.  Psychiatric: Mood and affect normal.    Diagnostics: None   Assessment and Plan:   1. Idiopathic urticaria     1. Discontinue Xolair administration  2. Auvi-Q 3.0, Benadryl, M.D./ER evaluation for allergic reaction  3. Further treatment?  I've discontinued Branch's Xolair administration and we'll see what happens as he moves forward without this biological agent regarding his recurrent episodes of urticaria and angioedema. 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 He'll keep in contact with me noting his response to this approach.  Allena Katz, MD [facility]

## 2016-08-26 ENCOUNTER — Telehealth: Payer: Self-pay | Admitting: *Deleted

## 2016-08-26 NOTE — Telephone Encounter (Signed)
Pt is wondering why he has a bill for $321. Would like a return call around 4pm tomorrow

## 2016-08-27 NOTE — Telephone Encounter (Signed)
Explained charges - xolair mixing & administration - he will start making pmts

## 2016-09-17 DIAGNOSIS — D509 Iron deficiency anemia, unspecified: Secondary | ICD-10-CM | POA: Diagnosis not present

## 2016-10-01 DIAGNOSIS — D560 Alpha thalassemia: Secondary | ICD-10-CM | POA: Diagnosis not present

## 2016-10-02 ENCOUNTER — Ambulatory Visit (INDEPENDENT_AMBULATORY_CARE_PROVIDER_SITE_OTHER): Payer: 59 | Admitting: Orthopaedic Surgery

## 2016-10-16 ENCOUNTER — Ambulatory Visit (INDEPENDENT_AMBULATORY_CARE_PROVIDER_SITE_OTHER): Payer: Managed Care, Other (non HMO) | Admitting: Orthopaedic Surgery

## 2016-10-21 ENCOUNTER — Ambulatory Visit (INDEPENDENT_AMBULATORY_CARE_PROVIDER_SITE_OTHER): Payer: Managed Care, Other (non HMO)

## 2016-10-21 ENCOUNTER — Ambulatory Visit (INDEPENDENT_AMBULATORY_CARE_PROVIDER_SITE_OTHER): Payer: Managed Care, Other (non HMO) | Admitting: Physician Assistant

## 2016-10-21 DIAGNOSIS — Z96642 Presence of left artificial hip joint: Secondary | ICD-10-CM

## 2016-10-21 DIAGNOSIS — M25552 Pain in left hip: Secondary | ICD-10-CM | POA: Diagnosis not present

## 2016-10-21 DIAGNOSIS — M1611 Unilateral primary osteoarthritis, right hip: Secondary | ICD-10-CM | POA: Diagnosis not present

## 2016-10-21 NOTE — Progress Notes (Signed)
Office Visit Note   Patient: Vincent Holt           Date of Birth: 02/24/1966           MRN: [id number] Visit Date: 10/21/2016              Requested by: Maris Berger, MD [address] [place], [place] [address] PCP: Maris Berger, MD   Assessment & Plan: Visit Diagnoses:  1. History of left hip replacement   2. Primary osteoarthritis of right hip     Plan: He will follow with [place] on a as needed basis. He develops any pain in either hip or concerns he knows that he can always be seen here in the office.  Follow-Up Instructions: Return if symptoms worsen or fail to improve.   Orders:  Orders Placed This Encounter  Procedures  . XR HIP UNILAT W OR W/O PELVIS 1V LEFT   No orders of the defined types were placed in this encounter.     Procedures: No procedures performed   Clinical Data: No additional findings.   Subjective: No chief complaint on file.   HPI [name] returns today follow-up of his left total hip arthroplasty now 2 years postop and also follow-up of his right hip which she has known osteoarthritis. States overall that both hips are doing well. He does note slight decreased range of motion of the right hip however. No concerns otherwise. Review of Systems No chest pain shortness breath fevers chills.  Objective: Vital Signs: There were no vitals taken for this visit.  Physical Exam  Constitutional: He is oriented to person, place, and time. He appears well-developed and well-nourished. No distress.  Pulmonary/Chest: Effort normal.  Neurological: He is alert and oriented to person, place, and time.  Psychiatric: He has a normal mood and affect.    Ortho Exam : Good range of motion of both hips without pain. He ambulates without any assistive devices and a nonantalgic gait.   Specialty Comments:   No specialty comments available.  Imaging: Xr Hip Unilat W Or W/o Pelvis 1v Left  Result Date: 10/21/2016 AP pelvis and  lateral view of left hip: In no acute fracture. Bilateral hips well located. Status post left total hip arthroplasty without any signs of complication or hardware failure. Moderate right hip arthritis.    PMFS History: Patient Active Problem List   Diagnosis Date Noted  . Chronic urticaria 01/18/2015  . Angioedema 01/18/2015  . Systemic arterial hypertension 01/18/2015  . Osteoarthritis of left hip 07/26/2014  . Status post total replacement of left hip 07/26/2014   Past Medical History:  Diagnosis Date  . Anemia   . Angio-edema   . Arthritis   . Diverticulosis   . Headache   . Heart murmur    was told yrs ago  . Hypertension    takes Bisoprolol-HCTZ daily  . Joint pain   . Joint swelling   . Osteoarthritis   . Pneumonia    hx of-80's  . Urticaria     Family History  Problem Relation Age of Onset  . Diabetes Father   . High blood pressure Brother     Past Surgical History:  Procedure Laterality Date  . achiles tendon surgery Left   . COLONOSCOPY    . ESOPHAGOGASTRODUODENOSCOPY    . TOTAL HIP ARTHROPLASTY Left 07/26/2014  . TOTAL HIP ARTHROPLASTY Left 07/26/2014   Procedure: LEFT TOTAL HIP ARTHROPLASTY ANTERIOR APPROACH;  Surgeon: Lind Guest  Ninfa Linden, MD;  Location: [facility];  Service: Orthopedics;  Laterality: Left;   Social History   Occupational History  . Not on file.   Social History Main Topics  . Smoking status: Former Research scientist (life sciences)  . Smokeless tobacco: Current User    Types: Snuff     Comment: quit smoking 20+yrs ago  . Alcohol use Yes     Comment: daily  . Drug use: No  . Sexual activity: Not Currently

## 2016-11-19 ENCOUNTER — Telehealth: Payer: Self-pay | Admitting: *Deleted

## 2016-11-19 NOTE — Telephone Encounter (Signed)
Will pay $50 per mo around the 11th of ea month  - also told her we did received her June payment of $30 - kt

## 2016-11-19 NOTE — Telephone Encounter (Signed)
Patient wife is wanting a return call. She says she sent in a payment on June 4th and wants to make sure we received it. She also wants to set up a payment plan.

## 2020-04-12 ENCOUNTER — Encounter: Payer: Self-pay | Admitting: Gastroenterology

## 2020-05-01 ENCOUNTER — Other Ambulatory Visit: Payer: Self-pay

## 2020-05-01 ENCOUNTER — Ambulatory Visit (AMBULATORY_SURGERY_CENTER): Payer: Self-pay

## 2020-05-01 VITALS — Ht 69.0 in | Wt 190.0 lb

## 2020-05-01 DIAGNOSIS — Z86010 Personal history of colonic polyps: Secondary | ICD-10-CM

## 2020-05-01 MED ORDER — SUTAB 1479-225-188 MG PO TABS: 1.0000 | ORAL_TABLET | ORAL | 0 refills | Status: DC

## 2020-05-01 NOTE — Progress Notes (Signed)
No egg or soy allergy known to patient  No issues with past sedation with any surgeries or procedures No intubation problems in the past  No FH of Malignant Hyperthermia No diet pills per patient No home 02 use per patient  No blood thinners per patient  Pt denies issues with constipation  No A fib or A flutter  EMMI video via [place] 19 guidelines implemented in [facility] today with Pt and RN  COVID vaccines completed on 02/2020 per pt;  Coupon given to pt in [facility] today , Code to Pharmacy  Due to the COVID-19 pandemic we are asking patients to follow certain guidelines.  Pt aware of COVID protocols and [facility] guidelines

## 2020-05-17 ENCOUNTER — Encounter: Payer: Self-pay | Admitting: Gastroenterology

## 2020-05-25 ENCOUNTER — Other Ambulatory Visit: Payer: Self-pay

## 2020-05-25 ENCOUNTER — Encounter: Payer: Self-pay | Admitting: Gastroenterology

## 2020-05-25 ENCOUNTER — Ambulatory Visit (AMBULATORY_SURGERY_CENTER): Payer: Managed Care, Other (non HMO) | Admitting: Gastroenterology

## 2020-05-25 VITALS — BP 109/69 | HR 73 | Temp 97.4°F | Resp 17 | Ht 69.0 in | Wt 190.0 lb

## 2020-05-25 DIAGNOSIS — D125 Benign neoplasm of sigmoid colon: Secondary | ICD-10-CM | POA: Diagnosis not present

## 2020-05-25 DIAGNOSIS — Z86010 Personal history of colonic polyps: Secondary | ICD-10-CM

## 2020-05-25 HISTORY — PX: COLONOSCOPY: SHX174

## 2020-05-25 MED ORDER — SODIUM CHLORIDE 0.9 % IV SOLN: 500.0000 mL | Freq: Once | INTRAVENOUS | Status: DC

## 2020-05-25 NOTE — Op Note (Signed)
[facility] Patient Name: Vincent Holt Procedure Date: 05/25/2020 10:18 AM MRN: [id number] Endoscopist: Jackquline Denmark , MD Age: 55 Referring MD:  Date of Birth: Aug 09, 1965 Gender: Male Account #: [id number] Procedure:                Colonoscopy Indications:              High risk colon cancer surveillance: Personal                            history of colonic polyps Medicines:                Monitored Anesthesia Care Procedure:                Pre-Anesthesia Assessment:                           - Prior to the procedure, a History and Physical                            was performed, and patient medications and                            allergies were reviewed. The patient's tolerance of                            previous anesthesia was also reviewed. The risks                            and benefits of the procedure and the sedation                            options and risks were discussed with the patient.                            All questions were answered, and informed consent                            was obtained. Prior Anticoagulants: The patient has                            taken no previous anticoagulant or antiplatelet                            agents. ASA Grade Assessment: II - A patient with                            mild systemic disease. After reviewing the risks                            and benefits, the patient was deemed in                            satisfactory condition to undergo the procedure.  After obtaining informed consent, the colonoscope                            was passed under direct vision. Throughout the                            procedure, the patient's blood pressure, pulse, and                            oxygen saturations were monitored continuously. The                            Olympus CF-HQ190 (#[id number]) Colonoscope was                            introduced through the anus and advanced to the  2                            cm into the ileum. The colonoscopy was performed                            without difficulty. The patient tolerated the                            procedure well. The quality of the bowel                            preparation was good. The terminal ileum, ileocecal                            valve, appendiceal orifice, and rectum were                            photographed. Scope In: 10:29:20 AM Scope Out: 10:39:46 AM Scope Withdrawal Time: 0 hours 6 minutes 59 seconds  Total Procedure Duration: 0 hours 10 minutes 26 seconds  Findings:                 A 6 mm polyp was found in the mid sigmoid colon.                            The polyp was sessile. The polyp was removed with a                            cold snare. Resection and retrieval were complete.                           Multiple medium-mouthed diverticula were found in                            the sigmoid colon and ascending colon.                           Non-bleeding internal hemorrhoids were found during  retroflexion. The hemorrhoids were small. Small                            posterior healed anal fissure was also noted.                           The terminal ileum appeared normal.                           The exam was otherwise without abnormality on                            direct and retroflexion views. Complications:            No immediate complications. Estimated Blood Loss:     Estimated blood loss: none. Impression:               - One 6 mm polyp in the mid sigmoid colon, removed                            with a cold snare. Resected and retrieved.                           - Pancolonic diverticulosis predominantly in the                            ascending colon.                           - The examined portion of the ileum was normal.                           - The examination was otherwise normal on direct                            and  retroflexion views. Recommendation:           - Patient has a contact number available for                            emergencies. The signs and symptoms of potential                            delayed complications were discussed with the                            patient. Return to normal activities tomorrow.                            Written discharge instructions were provided to the                            patient.                           - High fiber diet.                           -  Continue present medications.                           - Await pathology results.                           - Repeat colonoscopy for surveillance based on                            pathology results.                           - The findings and recommendations were discussed                            with the patient' wife Anderson Malta. Jackquline Denmark, MD 05/25/2020 10:45:45 AM This report has been signed electronically.

## 2020-05-25 NOTE — Patient Instructions (Addendum)
Handouts were given to you on polyps, diverticulosis and hemorrhoids. You may resume your current medications today. Await biopsy results.  May take 2-3 weeks to receive pathology results. Please call if any questions or concerns.    YOU HAD AN ENDOSCOPIC PROCEDURE TODAY AT THE [facility]:   Refer to the procedure report that was given to you for any specific questions about what was found during the examination.  If the procedure report does not answer your questions, please call your gastroenterologist to clarify.  If you requested that your care partner not be given the details of your procedure findings, then the procedure report has been included in a sealed envelope for you to review at your convenience later.  YOU SHOULD EXPECT: Some feelings of bloating in the abdomen. Passage of more gas than usual.  Walking can help get rid of the air that was put into your GI tract during the procedure and reduce the bloating. If you had a lower endoscopy (such as a colonoscopy or flexible sigmoidoscopy) you may notice spotting of blood in your stool or on the toilet paper. If you underwent a bowel prep for your procedure, you may not have a normal bowel movement for a few days.  Please Note:  You might notice some irritation and congestion in your nose or some drainage.  This is from the oxygen used during your procedure.  There is no need for concern and it should clear up in a day or so.  SYMPTOMS TO REPORT IMMEDIATELY:   Following lower endoscopy (colonoscopy or flexible sigmoidoscopy):  Excessive amounts of blood in the stool  Significant tenderness or worsening of abdominal pains  Swelling of the abdomen that is new, acute  Fever of 100F or higher   For urgent or emergent issues, a gastroenterologist can be reached at any hour by calling [phone]. Do not use MyChart messaging for urgent concerns.    DIET:  We do recommend a small meal at first, but then you may proceed  to your regular diet.  Drink plenty of fluids but you should avoid alcoholic beverages for 24 hours.  ACTIVITY:  You should plan to take it easy for the rest of today and you should NOT DRIVE or use heavy machinery until tomorrow (because of the sedation medicines used during the test).    FOLLOW UP: Our staff will call the number listed on your records 48-72 hours following your procedure to check on you and address any questions or concerns that you may have regarding the information given to you following your procedure. If we do not reach you, we will leave a message.  We will attempt to reach you two times.  During this call, we will ask if you have developed any symptoms of COVID 19. If you develop any symptoms (ie: fever, flu-like symptoms, shortness of breath, cough etc.) before then, please call [phone].  If you test positive for Covid 19 in the 2 weeks post procedure, please call and report this information to [place].    If any biopsies were taken you will be contacted by phone or by letter within the next 1-3 weeks.  Please call us at [phone] if you have not heard about the biopsies in 3 weeks.    SIGNATURES/CONFIDENTIALITY: You and/or your care partner have signed paperwork which will be entered into your electronic medical record.  These signatures attest to the fact that that the information above on your After Visit Summary has  been reviewed and is understood.  Full responsibility of the confidentiality of this discharge information lies with you and/or your care-partner.

## 2020-05-25 NOTE — Progress Notes (Signed)
No problems noted in the recovery room. maw 

## 2020-05-25 NOTE — Progress Notes (Signed)
Called to room to assist during endoscopic procedure.  Patient ID and intended procedure confirmed with present staff. Received instructions for my participation in the procedure from the performing physician.  

## 2020-05-25 NOTE — Progress Notes (Signed)
Report given to PACU, vss 

## 2020-05-25 NOTE — Progress Notes (Signed)
Pt's states no medical or surgical changes since previsit or office visit. ° ° °Vitals BC °

## 2020-05-29 ENCOUNTER — Telehealth: Payer: Self-pay

## 2020-05-29 NOTE — Telephone Encounter (Signed)
  Follow up Call-  Call back number 05/25/2020  Post procedure Call Back phone  # [phone]  Permission to leave phone message Yes  Some recent data might be hidden     Patient questions:  Do you have a fever, pain , or abdominal swelling? No. Pain Score  0 *  Have you tolerated food without any problems? Yes.    Have you been able to return to your normal activities? Yes.    Do you have any questions about your discharge instructions: Diet   No. Medications  No. Follow up visit  No.  Do you have questions or concerns about your Care? No.  Actions: * If pain score is 4 or above: No action needed, pain <4.   1. Have you developed a fever since your procedure? no  2.   Have you had an respiratory symptoms (SOB or cough) since your procedure? no  3.   Have you tested positive for COVID 19 since your procedure no  4.   Have you had any family members/close contacts diagnosed with the COVID 19 since your procedure?  no   If yes to any of these questions please route to Joylene John, RN and Joella Prince, RN

## 2020-06-05 ENCOUNTER — Encounter: Payer: Self-pay | Admitting: Gastroenterology

## 2023-10-01 ENCOUNTER — Other Ambulatory Visit (INDEPENDENT_AMBULATORY_CARE_PROVIDER_SITE_OTHER): Payer: Self-pay

## 2023-10-01 ENCOUNTER — Ambulatory Visit (INDEPENDENT_AMBULATORY_CARE_PROVIDER_SITE_OTHER): Admitting: Orthopaedic Surgery

## 2023-10-01 DIAGNOSIS — M1611 Unilateral primary osteoarthritis, right hip: Secondary | ICD-10-CM | POA: Diagnosis not present

## 2023-10-01 DIAGNOSIS — M25551 Pain in right hip: Secondary | ICD-10-CM

## 2023-10-01 NOTE — Progress Notes (Signed)
 The patient is a 58 year old gentleman who comes in with worsening right hip pain has been going on for a while now.  We actually replaced his left hip secondary to arthritis back in March 2016 and that has done very well.  At this point his right hip pain has been worsening for over a year and is detrimentally affecting his mobility, his quality of life and his actives daily living.  His wife is with him also says that is very painful for him.  He says sometimes pivoting and putting weight on the right hip is very bothersome.  He says left hip is doing great.  He said no acute changes in medical status.  He is not obese.  He is not a diabetic.  He does have high blood pressure which is treated.  Examination of his left operative hip shows no smoothly and fluid no blocks or rotation and no pain.  His right hip has significant pain in the groin with any attempts of internal and external rotation and stiffness with rotation.  An AP pelvis and lateral of the right hip shows bone-on-bone wear of the right hip in the superior lateral aspect.  There are sclerotic changes and cystic changes and flattening of the femoral head.  This is worse than when compared to films from 2016.  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  At this point injections or therapy will not help whatsoever based on the severity of his right hip arthritis.  Having had this before he is fully aware of the risk and benefits of surgery.  He understands what to expect from an intraoperative and postoperative standpoint.  He would like to have a right total hip arthroplasty later in the summer.  We will work on getting this scheduled.  All questions and concerns were addressed and answered.

## 2023-10-09 ENCOUNTER — Telehealth: Payer: Self-pay

## 2023-10-09 NOTE — Telephone Encounter (Signed)
 I called and spoke to patient about scheduling surgery for hip.  He will speak with wife about dates and call me back to schedule.  Gave my name and direct phone number.

## 2023-10-17 ENCOUNTER — Telehealth: Payer: Self-pay

## 2023-10-17 NOTE — Telephone Encounter (Signed)
 Returned patient's call to discuss scheduling surgery.  Left voice mail for him to return my call.

## 2023-10-22 ENCOUNTER — Telehealth: Payer: Self-pay

## 2023-10-22 NOTE — Telephone Encounter (Signed)
 Patient left voice mail stating he would like surgery on 12/30/23.  I called him back and left message stating I would put him down for that day.  I asked him to call me back to confirm.

## 2023-10-23 ENCOUNTER — Telehealth: Payer: Self-pay

## 2023-10-23 NOTE — Telephone Encounter (Signed)
Spoke with patient and confirmed surgery date.

## 2023-12-16 ENCOUNTER — Other Ambulatory Visit: Payer: Self-pay | Admitting: Physician Assistant

## 2023-12-16 DIAGNOSIS — Z01818 Encounter for other preprocedural examination: Secondary | ICD-10-CM

## 2023-12-23 NOTE — Pre-Procedure Instructions (Signed)
 Surgical Instructions   Your procedure is scheduled on Tuesday, August 12th. Report to [facility] Main Entrance A at 05:30 A.M., then check in with the Admitting office. Any questions or running late day of surgery: call [phone]  Questions prior to your surgery date: call [phone], Monday-Friday, 8am-4pm. If you experience any cold or flu symptoms such as cough, fever, chills, shortness of breath, etc. between now and your scheduled surgery, please notify us  at the above number.     Remember:  Do not eat after midnight the night before your surgery  You may drink clear liquids until 04:30 AM the morning of your surgery.   Clear liquids allowed are: Water, Non-Citrus Juices (without pulp), Carbonated Beverages, Clear Tea (no milk, honey, etc.), Black Coffee Only (NO MILK, CREAM OR POWDERED CREAMER of any kind), and Gatorade.  Patient Instructions  The night before surgery:  No food after midnight. ONLY clear liquids after midnight  The day of surgery (if you do NOT have diabetes):  Drink ONE (1) Pre-Surgery Clear Ensure by 04:30 AM the morning of surgery. Drink in one sitting. Do not sip.  This drink was given to you during your hospital  pre-op appointment visit.  Nothing else to drink after completing the  Pre-Surgery Clear Ensure.         If you have questions, please contact your surgeon's office.    Take these medicines the morning of surgery with A SIP OF WATER  amLODipine (NORVASC)  famotidine (PEPCID)  metoprolol succinate (TOPROL-XL)  timolol (TIMOPTIC) eye drops   One week prior to surgery, STOP taking any Aspirin  (unless otherwise instructed by your surgeon) Aleve, Naproxen, Ibuprofen, Motrin, Advil, Goody's, BC's, all herbal medications, fish oil, and non-prescription vitamins.                     Do NOT Smoke (Tobacco/Vaping) for 24 hours prior to your procedure.  If you use a CPAP at night, you may bring your mask/headgear for your overnight stay.    You will be asked to remove any contacts, glasses, piercing's, hearing aid's, dentures/partials prior to surgery. Please bring cases for these items if needed.    Patients discharged the day of surgery will not be allowed to drive home, and someone needs to stay with them for 24 hours.  SURGICAL WAITING ROOM VISITATION Patients may have no more than 2 support people in the waiting area - these visitors may rotate.   Pre-op nurse will coordinate an appropriate time for 1 ADULT support person, who may not rotate, to accompany patient in pre-op.  Children under the age of 86 must have an adult with them who is not the patient and must remain in the main waiting area with an adult.  If the patient needs to stay at the hospital during part of their recovery, the visitor guidelines for inpatient rooms apply.  Please refer to the [facility] website for the visitor guidelines for any additional information.   If you received a COVID test during your pre-op visit  it is requested that you wear a mask when out in public, stay away from anyone that may not be feeling well and notify your surgeon if you develop symptoms. If you have been in contact with anyone that has tested positive in the last 10 days please notify you surgeon.      Pre-operative 5 CHG Bathing Instructions   You can play a key role in reducing the risk of infection  after surgery. Your skin needs to be as free of germs as possible. You can reduce the number of germs on your skin by washing with CHG (chlorhexidine  gluconate) soap before surgery. CHG is an antiseptic soap that kills germs and continues to kill germs even after washing.   DO NOT use if you have an allergy to chlorhexidine /CHG or antibacterial soaps. If your skin becomes reddened or irritated, stop using the CHG and notify one of our RNs at [phone].   Please shower with the CHG soap starting 4 days before surgery using the following schedule:     Please keep  in mind the following:  DO NOT shave, including legs and underarms, starting the day of your first shower.   You may shave your face at any point before/day of surgery.  Place clean sheets on your bed the day you start using CHG soap. Use a clean washcloth (not used since being washed) for each shower. DO NOT sleep with pets once you start using the CHG.   CHG Shower Instructions:  Wash your face and private area with normal soap. If you choose to wash your hair, wash first with your normal shampoo.  After you use shampoo/soap, rinse your hair and body thoroughly to remove shampoo/soap residue.  Turn the water OFF and apply about 3 tablespoons (45 ml) of CHG soap to a CLEAN washcloth.  Apply CHG soap ONLY FROM YOUR NECK DOWN TO YOUR TOES (washing for 3-5 minutes)  DO NOT use CHG soap on face, private areas, open wounds, or sores.  Pay special attention to the area where your surgery is being performed.  If you are having back surgery, having someone wash your back for you may be helpful. Wait 2 minutes after CHG soap is applied, then you may rinse off the CHG soap.  Pat dry with a clean towel  Put on clean clothes/pajamas   If you choose to wear lotion, please use ONLY the CHG-compatible lotions that are listed below.  Additional instructions for the day of surgery: DO NOT APPLY any lotions, deodorants, cologne, or perfumes.   Do not bring valuables to the hospital. [facility] is not responsible for any belongings/valuables. Do not wear nail polish, gel polish, artificial nails, or any other type of covering on natural nails (fingers and toes) Do not wear jewelry or makeup Put on clean/comfortable clothes.  Please brush your teeth.  Ask your nurse before applying any prescription medications to the skin.     CHG Compatible Lotions   Aveeno Moisturizing lotion  Cetaphil Moisturizing Cream  Cetaphil Moisturizing Lotion  Clairol Herbal Essence Moisturizing Lotion, Dry Skin   Clairol Herbal Essence Moisturizing Lotion, Extra Dry Skin  Clairol Herbal Essence Moisturizing Lotion, Normal Skin  Curel Age Defying Therapeutic Moisturizing Lotion with Alpha Hydroxy  Curel Extreme Care Body Lotion  Curel Soothing Hands Moisturizing Hand Lotion  Curel Therapeutic Moisturizing Cream, Fragrance-Free  Curel Therapeutic Moisturizing Lotion, Fragrance-Free  Curel Therapeutic Moisturizing Lotion, Original Formula  Eucerin Daily Replenishing Lotion  Eucerin Dry Skin Therapy Plus Alpha Hydroxy Crme  Eucerin Dry Skin Therapy Plus Alpha Hydroxy Lotion  Eucerin Original Crme  Eucerin Original Lotion  Eucerin Plus Crme Eucerin Plus Lotion  Eucerin TriLipid Replenishing Lotion  Keri Anti-Bacterial Hand Lotion  Keri Deep Conditioning Original Lotion Dry Skin Formula Softly Scented  Keri Deep Conditioning Original Lotion, Fragrance Free Sensitive Skin Formula  Keri Lotion Fast Absorbing Fragrance Free Sensitive Skin Formula  Keri Lotion Fast Absorbing Softly Scented  Dry Skin Formula  Keri Original Lotion  Keri Skin Renewal Lotion Keri Silky Smooth Lotion  Keri Silky Smooth Sensitive Skin Lotion  Nivea Body Creamy Conditioning Oil  Nivea Body Extra Enriched Lotion  Nivea Body Original Lotion  Nivea Body Sheer Moisturizing Lotion Nivea Crme  Nivea Skin Firming Lotion  NutraDerm 30 Skin Lotion  NutraDerm Skin Lotion  NutraDerm Therapeutic Skin Cream  NutraDerm Therapeutic Skin Lotion  ProShield Protective Hand Cream  Provon moisturizing lotion  Please read over the following fact sheets that you were given.

## 2023-12-24 ENCOUNTER — Other Ambulatory Visit (HOSPITAL_COMMUNITY)

## 2023-12-24 ENCOUNTER — Encounter (HOSPITAL_COMMUNITY)
Admission: RE | Admit: 2023-12-24 | Discharge: 2023-12-24 | Disposition: A | Discharge status: Home / Self Care | Source: Ambulatory Visit | Attending: Orthopaedic Surgery | Admitting: Orthopaedic Surgery

## 2023-12-24 ENCOUNTER — Encounter (HOSPITAL_COMMUNITY): Payer: Self-pay

## 2023-12-24 ENCOUNTER — Other Ambulatory Visit: Payer: Self-pay

## 2023-12-24 VITALS — BP 145/88 | HR 88 | Temp 98.7°F | Resp 18 | Ht 68.0 in | Wt 187.1 lb

## 2023-12-24 DIAGNOSIS — Z01818 Encounter for other preprocedural examination: Secondary | ICD-10-CM | POA: Insufficient documentation

## 2023-12-24 LAB — CBC
HCT: 43.6 % (ref 39.0–52.0)
Hemoglobin: 13.9 g/dL (ref 13.0–17.0)
MCH: 26.7 pg (ref 26.0–34.0)
MCHC: 31.9 g/dL (ref 30.0–36.0)
MCV: 83.8 fL (ref 80.0–100.0)
Platelets: 278 K/uL (ref 150–400)
RBC: 5.2 MIL/uL (ref 4.22–5.81)
RDW: 15.3 % (ref 11.5–15.5)
WBC: 4.3 K/uL (ref 4.0–10.5)
nRBC: 0 % (ref 0.0–0.2)

## 2023-12-24 LAB — BASIC METABOLIC PANEL WITH GFR
Anion gap: 11 (ref 5–15)
BUN: 14 mg/dL (ref 6–20)
CO2: 27 mmol/L (ref 22–32)
Calcium: 9.8 mg/dL (ref 8.9–10.3)
Chloride: 101 mmol/L (ref 98–111)
Creatinine, Ser: 1.08 mg/dL (ref 0.61–1.24)
GFR, Estimated: >60 mL/min (ref >60)
Glucose, Bld: 111 mg/dL — ABNORMAL HIGH (ref 70–99)
Potassium: 3.7 mmol/L (ref 3.5–5.1)
Sodium: 139 mmol/L (ref 135–145)

## 2023-12-24 LAB — TYPE AND SCREEN
ABO/RH(D): O POS
Antibody Screen: NEGATIVE

## 2023-12-24 LAB — SURGICAL PCR SCREEN
MRSA, PCR: NEGATIVE
Staphylococcus aureus: NEGATIVE

## 2023-12-24 NOTE — Progress Notes (Signed)
 PCP - Dr. Debby Blanch Cardiologist -   PPM/ICD - denies Device Orders - na Rep Notified - na  Chest x-ray - na EKG - PAT, 12/24/2023 Stress Test -  ECHO -  Cardiac Cath -   Sleep Study -  Had a study, was not diagnosed with sleep apnea CPAP - na  Non-diabetic  Blood Thinner Instructions: denies Aspirin  Instructions:denies  ERAS Protcol -Ensure until 0430  Anesthesia review: No  Patient denies shortness of breath, fever, cough and chest pain at PAT appointment   All instructions explained to the patient, with a verbal understanding of the material. Patient agrees to go over the instructions while at home for a better understanding. Patient also instructed to self quarantine after being tested for COVID-19. The opportunity to ask questions was provided.

## 2023-12-29 NOTE — H&P (Signed)
 TOTAL HIP ADMISSION H&P  Patient is admitted for right total hip arthroplasty.  Subjective:  Chief Complaint: right hip pain  HPI: Vincent Holt, 58 y.o. male, has a history of pain and functional disability in the right hip(s) due to arthritis and patient has failed non-surgical conservative treatments for greater than 12 weeks to include NSAID's and/or analgesics and activity modification.  Onset of symptoms was gradual starting a while ago with gradually worsening course since that time.The patient noted no past surgery on the right hip(s).  Patient currently rates pain in the right hip at 10 out of 10 with activity. Patient has night pain, worsening of pain with activity and weight bearing, trendelenberg gait, pain that interfers with activities of daily living, and pain with passive range of motion. Patient has evidence of subchondral sclerosis, periarticular osteophytes, and joint space narrowing by imaging studies. This condition presents safety issues increasing the risk of falls.  There is no current active infection.  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   Past Medical History:  Diagnosis Date   Anemia    hx of   Angio-edema    Anxiety    on meds   Arthritis    bilateral hips   Diverticulosis    GERD (gastroesophageal reflux disease)    on meds   Glaucoma    uses eye drops   Headache    Heart murmur    was told yrs ago-childhood   Hypertension    takes Bisoprolol -HCTZ daily   Joint pain    Joint swelling    Osteoarthritis    Pneumonia    hx of-80's   Urticaria     Past Surgical History:  Procedure Laterality Date   achiles tendon surgery Left 2000   COLONOSCOPY  2016   COLONOSCOPY  05/25/2020    ESOPHAGOGASTRODUODENOSCOPY     TOTAL HIP ARTHROPLASTY Left 07/26/2014   Procedure: LEFT TOTAL HIP ARTHROPLASTY ANTERIOR APPROACH;  Surgeon: Lonni CINDERELLA Poli, MD;  Location: [facility] OR;  Service: Orthopedics;  Laterality: Left;   WISDOM TOOTH EXTRACTION      No current facility-administered medications for this encounter.   Current Outpatient Medications  Medication Sig Dispense Refill Last Dose/Taking   amLODipine  (NORVASC ) 10 MG tablet Take 10 mg by mouth daily.   Taking   amoxicillin (AMOXIL) 500 MG capsule Take 2,000 mg by mouth daily as needed. Take 1 hr prior to dental procedure   Taking As Needed   EPINEPHrine  0.3 mg/0.3 mL IJ SOAJ injection Inject 0.3 mg into the muscle as needed for anaphylaxis.   Taking As Needed   escitalopram  (LEXAPRO ) 10 MG tablet Take 10 mg by mouth at bedtime.   Taking   famotidine  (PEPCID ) 40 MG tablet Take 40 mg by mouth 2 (two) times daily.   Taking   hydrochlorothiazide  (HYDRODIURIL ) 25 MG tablet Take 25 mg by mouth daily.   Taking   latanoprost  (XALATAN ) 0.005 % ophthalmic solution Place 1 drop into both eyes at bedtime.   Taking   losartan  (COZAAR ) 100 MG tablet Take 100 mg by mouth daily.   Taking   metoprolol  succinate (TOPROL -XL) 100 MG 24 hr tablet Take 100 mg by mouth daily.   Taking   Multiple Vitamins-Minerals (MULTIVITAMIN WITH MINERALS)  tablet Take 1 tablet by mouth daily.   Taking   timolol  (TIMOPTIC ) 0.5 % ophthalmic solution Place 1 drop into both eyes in the morning.   Taking   triamcinolone cream (KENALOG) 0.1 % Apply 1 application topically daily as needed (irritation).   Taking As Needed   No Known Allergies  Social History   Tobacco Use   Smoking status: Former    Current packs/day: 0.00    Types: Cigarettes    Quit date: 1996    Years since quitting: 29.6   Smokeless tobacco: Current    Types: Snuff   Tobacco comments:    quit smoking 20+yrs ago  Substance Use Topics   Alcohol use: Yes    Alcohol/week: 14.0 standard drinks of  alcohol    Types: 14 Standard drinks or equivalent per week    Comment: daily; beer and liquor    Family History  Problem Relation Age of Onset   Diabetes Father    High blood pressure Brother    Colon cancer Neg Hx    Colon polyps Neg Hx    Esophageal cancer Neg Hx    Rectal cancer Neg Hx    Stomach cancer Neg Hx      Review of Systems  Objective:  Physical Exam Vitals reviewed.  Constitutional:      Appearance: Normal appearance. He is normal weight.  HENT:     Head: Normocephalic and atraumatic.  Eyes:     Extraocular Movements: Extraocular movements intact.     Pupils: Pupils are equal, round, and reactive to light.  Cardiovascular:     Rate and Rhythm: Normal rate and regular rhythm.  Pulmonary:     Breath sounds: Normal breath sounds.  Abdominal:     Palpations: Abdomen is soft.  Musculoskeletal:     Cervical back: Normal range of motion and neck supple.     Right hip: Tenderness and bony tenderness present. Decreased range of motion. Decreased strength.  Neurological:     Mental Status: He is alert and oriented to person, place, and time.  Psychiatric:        Behavior: Behavior normal.     Vital signs in last 24 hours:    Labs:   Estimated body mass index is 28.45 kg/m as calculated from the following:   Height as of 12/24/23: 5' 8 (1.727 m).   Weight as of 12/24/23: 84.9 kg.   Imaging Review Plain radiographs demonstrate severe degenerative joint disease of the right hip(s). The bone quality appears to be excellent for age and reported activity level.      Assessment/Plan:  End stage arthritis, right hip(s)  The patient history, physical examination, clinical judgement of the provider and imaging studies are consistent with end stage degenerative joint disease of the right hip(s) and total hip arthroplasty is deemed medically necessary. The treatment options including medical management, injection therapy, arthroscopy and arthroplasty were  discussed at length. The risks and benefits of total hip arthroplasty were presented and reviewed. The risks due to aseptic loosening, infection, stiffness, dislocation/subluxation,  thromboembolic complications and other imponderables were discussed.  The patient acknowledged the explanation, agreed to proceed with the plan and consent was signed. Patient is being admitted for inpatient treatment for surgery, pain control, PT, OT, prophylactic antibiotics, VTE prophylaxis, progressive ambulation and ADL's and discharge planning.The patient is planning to be discharged home with home health services

## 2023-12-30 ENCOUNTER — Observation Stay (HOSPITAL_COMMUNITY)
Admission: RE | Admit: 2023-12-30 | Discharge: 2023-12-31 | Disposition: A | Discharge status: Home / Self Care | Source: Ambulatory Visit | Attending: Orthopaedic Surgery | Admitting: Orthopaedic Surgery

## 2023-12-30 ENCOUNTER — Encounter (HOSPITAL_COMMUNITY)
Admission: RE | Disposition: A | Discharge status: Home / Self Care | Payer: Self-pay | Source: Ambulatory Visit | Attending: Orthopaedic Surgery

## 2023-12-30 ENCOUNTER — Observation Stay (HOSPITAL_COMMUNITY)

## 2023-12-30 ENCOUNTER — Ambulatory Visit (HOSPITAL_BASED_OUTPATIENT_CLINIC_OR_DEPARTMENT_OTHER): Admitting: Certified Registered Nurse Anesthetist

## 2023-12-30 ENCOUNTER — Ambulatory Visit (HOSPITAL_COMMUNITY): Admitting: Certified Registered Nurse Anesthetist

## 2023-12-30 ENCOUNTER — Ambulatory Visit (HOSPITAL_COMMUNITY)

## 2023-12-30 ENCOUNTER — Other Ambulatory Visit: Payer: Self-pay

## 2023-12-30 DIAGNOSIS — I10 Essential (primary) hypertension: Secondary | ICD-10-CM | POA: Diagnosis not present

## 2023-12-30 DIAGNOSIS — Z87891 Personal history of nicotine dependence: Secondary | ICD-10-CM | POA: Insufficient documentation

## 2023-12-30 DIAGNOSIS — Z96641 Presence of right artificial hip joint: Secondary | ICD-10-CM | POA: Insufficient documentation

## 2023-12-30 DIAGNOSIS — F1090 Alcohol use, unspecified, uncomplicated: Secondary | ICD-10-CM | POA: Insufficient documentation

## 2023-12-30 DIAGNOSIS — Z7982 Long term (current) use of aspirin: Secondary | ICD-10-CM | POA: Diagnosis not present

## 2023-12-30 DIAGNOSIS — M1611 Unilateral primary osteoarthritis, right hip: Secondary | ICD-10-CM

## 2023-12-30 HISTORY — PX: TOTAL HIP ARTHROPLASTY: SHX124

## 2023-12-30 LAB — ABO/RH: ABO/RH(D): O POS

## 2023-12-30 SURGERY — ARTHROPLASTY, HIP, TOTAL, ANTERIOR APPROACH
Anesthesia: Monitor Anesthesia Care | Site: Hip | Laterality: Right

## 2023-12-30 MED ORDER — CEFAZOLIN SODIUM-DEXTROSE 2-4 GM/100ML-% IV SOLN
2.0000 g | INTRAVENOUS | Status: AC
  Administered 2023-12-30 (×2): 2 g via INTRAVENOUS
  Filled 2023-12-30: qty 100

## 2023-12-30 MED ORDER — CHLORHEXIDINE GLUCONATE 0.12 % MT SOLN
15.0000 mL | Freq: Once | OROMUCOSAL | Status: AC
  Administered 2023-12-30 (×2): 15 mL via OROMUCOSAL
  Filled 2023-12-30: qty 15

## 2023-12-30 MED ORDER — SODIUM CHLORIDE 0.9 % IV SOLN: INTRAVENOUS | Status: DC

## 2023-12-30 MED ORDER — ACETAMINOPHEN 500 MG PO TABS
1000.0000 mg | ORAL_TABLET | Freq: Once | ORAL | Status: AC
  Administered 2023-12-30 (×2): 1000 mg via ORAL
  Filled 2023-12-30: qty 2

## 2023-12-30 MED ORDER — SODIUM CHLORIDE 0.9 % IR SOLN
Status: DC | PRN
  Administered 2023-12-30 (×2): 1000 mL

## 2023-12-30 MED ORDER — LATANOPROST 0.005 % OP SOLN
1.0000 [drp] | Freq: Every day | OPHTHALMIC | Status: DC
  Filled 2023-12-30: qty 2.5

## 2023-12-30 MED ORDER — POVIDONE-IODINE 10 % EX SWAB
2.0000 | Freq: Once | CUTANEOUS | Status: AC
  Administered 2023-12-30 (×2): 2 via TOPICAL

## 2023-12-30 MED ORDER — LOSARTAN POTASSIUM 50 MG PO TABS
100.0000 mg | ORAL_TABLET | Freq: Every day | ORAL | Status: DC
  Administered 2023-12-30 (×2): 100 mg via ORAL
  Filled 2023-12-30: qty 2

## 2023-12-30 MED ORDER — TIMOLOL MALEATE 0.5 % OP SOLN
1.0000 [drp] | Freq: Every morning | OPHTHALMIC | Status: DC
  Administered 2023-12-31 (×2): 1 [drp] via OPHTHALMIC
  Filled 2023-12-30: qty 5

## 2023-12-30 MED ORDER — TRANEXAMIC ACID-NACL 1000-0.7 MG/100ML-% IV SOLN
1000.0000 mg | INTRAVENOUS | Status: AC
  Administered 2023-12-30 (×2): 1000 mg via INTRAVENOUS
  Filled 2023-12-30: qty 100

## 2023-12-30 MED ORDER — ESCITALOPRAM OXALATE 10 MG PO TABS
10.0000 mg | ORAL_TABLET | Freq: Every day | ORAL | Status: DC
  Administered 2023-12-30 (×2): 10 mg via ORAL
  Filled 2023-12-30: qty 1

## 2023-12-30 MED ORDER — OXYCODONE HCL 5 MG PO TABS
5.0000 mg | ORAL_TABLET | ORAL | Status: DC | PRN
  Administered 2023-12-30 – 2023-12-31 (×10): 10 mg via ORAL
  Filled 2023-12-30 (×3): qty 2

## 2023-12-30 MED ORDER — METOCLOPRAMIDE HCL 5 MG/ML IJ SOLN: 5.0000 mg | Freq: Three times a day (TID) | INTRAMUSCULAR | Status: DC | PRN

## 2023-12-30 MED ORDER — 0.9 % SODIUM CHLORIDE (POUR BTL) OPTIME
TOPICAL | Status: DC | PRN
  Administered 2023-12-30 (×2): 1000 mL

## 2023-12-30 MED ORDER — METHOCARBAMOL 1000 MG/10ML IJ SOLN: 500.0000 mg | Freq: Four times a day (QID) | INTRAMUSCULAR | Status: DC | PRN

## 2023-12-30 MED ORDER — PROPOFOL 500 MG/50ML IV EMUL
INTRAVENOUS | Status: DC | PRN
  Administered 2023-12-30 (×2): 100 ug/kg/min via INTRAVENOUS

## 2023-12-30 MED ORDER — PROPOFOL 10 MG/ML IV BOLUS
INTRAVENOUS | Status: AC
  Filled 2023-12-30: qty 20

## 2023-12-30 MED ORDER — OXYCODONE HCL 5 MG PO TABS
10.0000 mg | ORAL_TABLET | ORAL | Status: DC | PRN
  Administered 2023-12-31 (×2): 10 mg via ORAL
  Filled 2023-12-30 (×3): qty 2

## 2023-12-30 MED ORDER — ONDANSETRON HCL 4 MG PO TABS: 4.0000 mg | ORAL_TABLET | Freq: Four times a day (QID) | ORAL | Status: DC | PRN

## 2023-12-30 MED ORDER — PHENYLEPHRINE HCL-NACL 20-0.9 MG/250ML-% IV SOLN
INTRAVENOUS | Status: DC | PRN
Start: 2023-12-30 — End: 2023-12-30
  Administered 2023-12-30 (×2): 25 ug/min via INTRAVENOUS

## 2023-12-30 MED ORDER — MIDAZOLAM HCL 5 MG/5ML IJ SOLN
INTRAMUSCULAR | Status: DC | PRN
  Administered 2023-12-30 (×2): 2 mg via INTRAVENOUS

## 2023-12-30 MED ORDER — BUPIVACAINE IN DEXTROSE 0.75-8.25 % IT SOLN
INTRATHECAL | Status: DC | PRN
  Administered 2023-12-30 (×2): 1.8 mL via INTRATHECAL

## 2023-12-30 MED ORDER — DIPHENHYDRAMINE HCL 12.5 MG/5ML PO ELIX: 12.5000 mg | ORAL_SOLUTION | ORAL | Status: DC | PRN

## 2023-12-30 MED ORDER — ACETAMINOPHEN 325 MG PO TABS: 325.0000 mg | ORAL_TABLET | Freq: Four times a day (QID) | ORAL | Status: DC | PRN

## 2023-12-30 MED ORDER — PHENOL 1.4 % MT LIQD: 1.0000 | OROMUCOSAL | Status: DC | PRN

## 2023-12-30 MED ORDER — ALUM & MAG HYDROXIDE-SIMETH 200-200-20 MG/5ML PO SUSP: 30.0000 mL | ORAL | Status: DC | PRN

## 2023-12-30 MED ORDER — ADULT MULTIVITAMIN W/MINERALS CH
1.0000 | ORAL_TABLET | Freq: Every day | ORAL | Status: DC
  Administered 2023-12-30 – 2023-12-31 (×4): 1 via ORAL
  Filled 2023-12-30 (×2): qty 1

## 2023-12-30 MED ORDER — METOCLOPRAMIDE HCL 5 MG PO TABS: 5.0000 mg | ORAL_TABLET | Freq: Three times a day (TID) | ORAL | Status: DC | PRN

## 2023-12-30 MED ORDER — AMLODIPINE BESYLATE 10 MG PO TABS: 10.0000 mg | ORAL_TABLET | Freq: Every day | ORAL | Status: DC

## 2023-12-30 MED ORDER — HYDROCHLOROTHIAZIDE 25 MG PO TABS
25.0000 mg | ORAL_TABLET | Freq: Every day | ORAL | Status: DC
  Administered 2023-12-30 (×2): 25 mg via ORAL
  Filled 2023-12-30: qty 1

## 2023-12-30 MED ORDER — METHOCARBAMOL 500 MG PO TABS
500.0000 mg | ORAL_TABLET | Freq: Four times a day (QID) | ORAL | Status: DC | PRN
  Administered 2023-12-30 – 2023-12-31 (×6): 500 mg via ORAL
  Filled 2023-12-30 (×3): qty 1

## 2023-12-30 MED ORDER — CEFAZOLIN SODIUM-DEXTROSE 2-4 GM/100ML-% IV SOLN
2.0000 g | Freq: Four times a day (QID) | INTRAVENOUS | Status: AC
  Administered 2023-12-30 (×4): 2 g via INTRAVENOUS
  Filled 2023-12-30 (×2): qty 100

## 2023-12-30 MED ORDER — ASPIRIN 81 MG PO CHEW
81.0000 mg | CHEWABLE_TABLET | Freq: Two times a day (BID) | ORAL | Status: DC
  Administered 2023-12-30 – 2023-12-31 (×4): 81 mg via ORAL
  Filled 2023-12-30 (×2): qty 1

## 2023-12-30 MED ORDER — HYDROMORPHONE HCL 1 MG/ML IJ SOLN: 0.5000 mg | INTRAMUSCULAR | Status: DC | PRN

## 2023-12-30 MED ORDER — LACTATED RINGERS IV SOLN: INTRAVENOUS | Status: DC

## 2023-12-30 MED ORDER — FENTANYL CITRATE (PF) 250 MCG/5ML IJ SOLN
INTRAMUSCULAR | Status: AC
  Filled 2023-12-30: qty 5

## 2023-12-30 MED ORDER — ONDANSETRON HCL 4 MG/2ML IJ SOLN: 4.0000 mg | Freq: Four times a day (QID) | INTRAMUSCULAR | Status: DC | PRN

## 2023-12-30 MED ORDER — MENTHOL 3 MG MT LOZG: 1.0000 | LOZENGE | OROMUCOSAL | Status: DC | PRN

## 2023-12-30 MED ORDER — ORAL CARE MOUTH RINSE: 15.0000 mL | Freq: Once | OROMUCOSAL | Status: AC

## 2023-12-30 MED ORDER — FAMOTIDINE 20 MG PO TABS
40.0000 mg | ORAL_TABLET | Freq: Two times a day (BID) | ORAL | Status: DC
  Administered 2023-12-30 – 2023-12-31 (×4): 40 mg via ORAL
  Filled 2023-12-30 (×2): qty 2

## 2023-12-30 MED ORDER — MIDAZOLAM HCL 2 MG/2ML IJ SOLN
INTRAMUSCULAR | Status: AC
  Filled 2023-12-30: qty 2

## 2023-12-30 MED ORDER — METOPROLOL SUCCINATE ER 100 MG PO TB24: 100.0000 mg | ORAL_TABLET | Freq: Every day | ORAL | Status: DC

## 2023-12-30 MED ORDER — DOCUSATE SODIUM 100 MG PO CAPS
100.0000 mg | ORAL_CAPSULE | Freq: Two times a day (BID) | ORAL | Status: DC
  Administered 2023-12-30 – 2023-12-31 (×4): 100 mg via ORAL
  Filled 2023-12-30 (×2): qty 1

## 2023-12-30 SURGICAL SUPPLY — 42 items
BAG COUNTER SPONGE SURGICOUNT (BAG) ×1 IMPLANT
BENZOIN TINCTURE PRP APPL 2/3 (GAUZE/BANDAGES/DRESSINGS) ×1 IMPLANT
BLADE CLIPPER SURG (BLADE) IMPLANT
BLADE SAW SGTL 18X1.27X75 (BLADE) ×1 IMPLANT
COVER SURGICAL LIGHT HANDLE (MISCELLANEOUS) ×1 IMPLANT
DRAPE C-ARM 42X72 X-RAY (DRAPES) ×1 IMPLANT
DRAPE STERI IOBAN 125X83 (DRAPES) ×1 IMPLANT
DRAPE U-SHAPE 47X51 STRL (DRAPES) ×3 IMPLANT
DRSG AQUACEL AG ADV 3.5X10 (GAUZE/BANDAGES/DRESSINGS) ×1 IMPLANT
DURAPREP 26ML APPLICATOR (WOUND CARE) ×1 IMPLANT
ELECT BLADE 6.5 EXT (BLADE) IMPLANT
ELECTRODE BLDE 4.0 EZ CLN MEGD (MISCELLANEOUS) ×1 IMPLANT
ELECTRODE REM PT RTRN 9FT ADLT (ELECTROSURGICAL) ×1 IMPLANT
FACESHIELD WRAPAROUND OR TEAM (MASK) ×2 IMPLANT
GLOVE BIOGEL PI IND STRL 8 (GLOVE) ×2 IMPLANT
GLOVE ECLIPSE 8.0 STRL XLNG CF (GLOVE) ×1 IMPLANT
GLOVE ORTHO TXT STRL SZ7.5 (GLOVE) ×2 IMPLANT
GOWN STRL REUS W/ TWL LRG LVL3 (GOWN DISPOSABLE) ×2 IMPLANT
GOWN STRL REUS W/ TWL XL LVL3 (GOWN DISPOSABLE) ×2 IMPLANT
HEAD CERAMIC 36 PLUS5 (Hips) IMPLANT
KIT BASIN OR (CUSTOM PROCEDURE TRAY) ×1 IMPLANT
KIT TURNOVER KIT B (KITS) ×1 IMPLANT
LINER NEUTRAL 52X36MM PLUS 4 (Liner) IMPLANT
MANIFOLD NEPTUNE II (INSTRUMENTS) ×1 IMPLANT
NS IRRIG 1000ML POUR BTL (IV SOLUTION) ×1 IMPLANT
PACK TOTAL JOINT (CUSTOM PROCEDURE TRAY) ×1 IMPLANT
PAD ARMBOARD POSITIONER FOAM (MISCELLANEOUS) ×1 IMPLANT
PIN SECTOR W/GRIP ACE CUP 52MM (Hips) IMPLANT
SET HNDPC FAN SPRY TIP SCT (DISPOSABLE) ×1 IMPLANT
STAPLER SKIN PROX 35W (STAPLE) IMPLANT
STEM FEMORAL SZ5 HIGH ACTIS (Stem) IMPLANT
STRIP CLOSURE SKIN 1/2X4 (GAUZE/BANDAGES/DRESSINGS) ×2 IMPLANT
SUT ETHIBOND NAB CT1 #1 30IN (SUTURE) ×1 IMPLANT
SUT MNCRL AB 4-0 PS2 18 (SUTURE) IMPLANT
SUT VIC AB 0 CT1 27XBRD ANBCTR (SUTURE) ×1 IMPLANT
SUT VIC AB 1 CT1 27XBRD ANBCTR (SUTURE) ×1 IMPLANT
SUT VIC AB 2-0 CT1 TAPERPNT 27 (SUTURE) ×1 IMPLANT
TOWEL GREEN STERILE (TOWEL DISPOSABLE) ×1 IMPLANT
TOWEL GREEN STERILE FF (TOWEL DISPOSABLE) ×1 IMPLANT
TRAY CATH INTERMITTENT SS 16FR (CATHETERS) IMPLANT
TRAY FOLEY W/BAG SLVR 16FR ST (SET/KITS/TRAYS/PACK) IMPLANT
WATER STERILE IRR 1000ML POUR (IV SOLUTION) ×2 IMPLANT

## 2023-12-30 NOTE — Interval H&P Note (Signed)
 History and Physical Interval Note: The patient understands that he is here today for right total hip replacement to treat his significant right hip pain and arthritis.  There has been no acute or interval change in his medical status.  The risks and benefits of surgery have been discussed in detail and informed consent has been obtained.  The right operative hip has been marked.  12/30/2023 7:02 AM  Vincent Holt  has presented today for surgery, with the diagnosis of osteoarthritis right hip.  The various methods of treatment have been discussed with the patient and family. After consideration of risks, benefits and other options for treatment, the patient has consented to  Procedure(s): ARTHROPLASTY, HIP, TOTAL, ANTERIOR APPROACH (Right) as a surgical intervention.  The patient's history has been reviewed, patient examined, no change in status, stable for surgery.  I have reviewed the patient's chart and labs.  Questions were answered to the patient's satisfaction.     Lonni CINDERELLA Poli

## 2023-12-30 NOTE — Evaluation (Signed)
 Physical Therapy Evaluation Patient Details Name: Vincent Holt MRN: [id number] DOB: 1965/12/27 Today's Date: 12/30/2023  History of Present Illness  58 y.o. male presents to [facility] hospital on 12/30/2023 for elective R THA. PMH includes L THA, HTN, angioedema.  Clinical Impression  Pt presents to PT with deficits in functional mobility, gait, balance, strength, ROM. Pt is able to ambulate for household distances with support of the RW. PT provides education on the THA exercise packet and encourages frequent mobilization with staff assistance. PT will follow up tomorrow for a progression of gait and to initiate stair training.      If plan is discharge home, recommend the following: A little help with bathing/dressing/bathroom;Assistance with cooking/housework;Assist for transportation;Help with stairs or ramp for entrance   Can travel by private vehicle        Equipment Recommendations None recommended by PT  Recommendations for Other Services       Functional Status Assessment Patient has had a recent decline in their functional status and demonstrates the ability to make significant improvements in function in a reasonable and predictable amount of time.     Precautions / Restrictions Precautions Precautions: Fall Recall of Precautions/Restrictions: Intact Precaution/Restrictions Comments: direct anterior THA Restrictions Weight Bearing Restrictions Per Provider Order: Yes RLE Weight Bearing Per Provider Order: Weight bearing as tolerated      Mobility  Bed Mobility Overal bed mobility: Needs Assistance Bed Mobility: Supine to Sit, Sit to Supine     Supine to sit: Supervision Sit to supine: Supervision        Transfers Overall transfer level: Needs assistance Equipment used: Rolling walker (2 wheels) Transfers: Sit to/from Stand Sit to Stand: Supervision                Ambulation/Gait Ambulation/Gait assistance: Supervision Gait Distance (Feet): 150  Feet Assistive device: Rolling walker (2 wheels) Gait Pattern/deviations: Step-through pattern Gait velocity: reduced Gait velocity interpretation: <1.8 ft/sec, indicate of risk for recurrent falls   General Gait Details: slowed step-through gait, increased trunk flexion with fatigue  Stairs            Wheelchair Mobility     Tilt Bed    Modified Rankin (Stroke Patients Only)       Balance Overall balance assessment: Needs assistance Sitting-balance support: No upper extremity supported, Feet supported Sitting balance-Leahy Scale: Good     Standing balance support: Single extremity supported, Reliant on assistive device for balance Standing balance-Leahy Scale: Poor                               Pertinent Vitals/Pain Pain Assessment Pain Assessment: 0-10 Pain Score: 8  Pain Location: R hip Pain Descriptors / Indicators: Sore Pain Intervention(s): Monitored during session    Home Living Family/patient expects to be discharged to:: Private residence Living Arrangements: Spouse/significant other;Children Available Help at Discharge: Family;Available 24 hours/day Type of Home: House Home Access: Stairs to enter Entrance Stairs-Rails: None Entrance Stairs-Number of Steps: 1   Home Layout: One level;Laundry or work area in [employer] Equipment: Agricultural consultant (2 wheels);Cane - single point;BSC/3in1      Prior Function Prior Level of Function : Independent/Modified Independent;Working/employed;Driving             Mobility Comments: ambulatory without DME       Extremity/Trunk Assessment   Upper Extremity Assessment Upper Extremity Assessment: Overall WFL for tasks assessed    Lower Extremity Assessment Lower Extremity Assessment:  RLE deficits/detail RLE Deficits / Details: generalized post-op weakness as anticipated on POD 0    Cervical / Trunk Assessment Cervical / Trunk Assessment: Normal  Communication    Communication Communication: No apparent difficulties    Cognition Arousal: Alert Behavior During Therapy: WFL for tasks assessed/performed   PT - Cognitive impairments: No apparent impairments                         Following commands: Intact       Cueing Cueing Techniques: Verbal cues     General Comments General comments (skin integrity, edema, etc.): VSS on RA    Exercises     Assessment/Plan    PT Assessment Patient needs continued PT services  PT Problem List Decreased strength;Decreased activity tolerance;Decreased balance;Decreased mobility;Decreased knowledge of use of DME;Pain       PT Treatment Interventions DME instruction;Gait training;Stair training;Functional mobility training;Therapeutic activities;Therapeutic exercise;Balance training;Neuromuscular re-education;Patient/family education    PT Goals (Current goals can be found in the Care Plan section)  Acute Rehab PT Goals Patient Stated Goal: to return to independence PT Goal Formulation: With patient Time For Goal Achievement: 01/03/24 Potential to Achieve Goals: Good    Frequency 7X/week     Co-evaluation               AM-PAC PT 6 Clicks Mobility  Outcome Measure Help needed turning from your back to your side while in a flat bed without using bedrails?: A Little Help needed moving from lying on your back to sitting on the side of a flat bed without using bedrails?: A Little Help needed moving to and from a bed to a chair (including a wheelchair)?: A Little Help needed standing up from a chair using your arms (e.g., wheelchair or bedside chair)?: A Little Help needed to walk in hospital room?: A Little Help needed climbing 3-5 steps with a railing? : A Little 6 Click Score: 18    End of Session Equipment Utilized During Treatment: Gait belt Activity Tolerance: Patient tolerated treatment well Patient left: in bed;with call bell/phone within reach Nurse Communication:  Mobility status PT Visit Diagnosis: Other abnormalities of gait and mobility (R26.89);Muscle weakness (generalized) (M62.81);Pain Pain - Right/Left: Right Pain - part of body: Hip    Time: 8765-8697 PT Time Calculation (min) (ACUTE ONLY): 28 min   Charges:   PT Evaluation $PT Eval Low Complexity: 1 Low   PT General Charges $$ ACUTE PT VISIT: 1 Visit         Bernardino JINNY Ruth, PT, DPT Acute Rehabilitation Office [phone]   Bernardino JINNY Ruth 12/30/2023, 1:18 PM

## 2023-12-30 NOTE — Op Note (Signed)
 Operative Note  Date of operation: 12/30/2023 Preoperative diagnosis: Right hip primary osteoarthritis Postoperative diagnosis: Same  Procedure: Right direct anterior total hip arthroplasty  Implants: Implant Name Type Inv. Item Serial No. Manufacturer Lot No. LRB No. Used Action  PIN SECTOR W/GRIP ACE CUP - [id number] Hips PIN SECTOR W/GRIP ACE CUP  [employer]PU[employer] [id number] Right 1 Implanted  LINER NEUTRAL 52X36MM PLUS 4 - [id number] Liner LINER NEUTRAL 52X36MM PLUS 4  DEPUY [employer]TH[employer] [id number] Right 1 Implanted  HEAD CERAMIC 36 PLUS5 - [id number] Hips HEAD CERAMIC 36 PLUS5  DEPU[employer] [id number] Right 1 Implanted  STEM FEMORAL SZ5 HIGH ACTIS - [id number] Stem STEM FEMORAL SZ5 HIGH ACTIS  [employer] [id number] Right 1 Implanted   Surgeon: Lonni GRADE. Vernetta, MD Assistant: Tory Gaskins, PA-C  Anesthesia: Spinal Antibiotics: IV Ancef  EBL: 150 cc Complications: None  Indications: The patient is a very active 58 year old gentleman with debilitating arthritis involving his right hip that has been well-documented clinical exam and x-ray findings.  He has tried and failed conservative treatment for over a year now.  We actually replaced his left hip secondary to arthritis when he was only 58 years old back in 2016 and that is done very well.  At this point his right hip pain is daily and it is definitely affecting his mobility, his quality of life and his actives daily living to the point he does wish to proceed with a hip replacement.  We agree with this as well.  We did discuss the risks of acute blood loss anemia, nerve vessel injury, fracture, infection, DVT, dislocation, implant failure, leg length differences and wound healing issues.  He understands that our goals are hopefully decreased pain, improved mobility and improved quality of life.  Procedure description: After informed consent was obtained and the appropriate right hip was marked, the patient was brought  to the operating room and sat up on the stretcher where spinal anesthesia was obtained.  He was then laid in a supine position on the stretcher and a Foley catheter was placed.  Traction boots were placed on both his feet and next he was placed supine on the Hana fracture table with a perineal post and placed in both legs and inline skeletal traction devices with no traction applied.  His right operative hip and pelvis were assessed radiographically.  The right hip was prepped and draped with DuraPrep and sterile drapes.  A timeout was called and he was identified as the correct patient and the correct right hip.  An incision was then made just inferior and posterior to the ASIS and carried slightly obliquely down the leg.  Dissection was carried down to the tensor fascia lata muscle and the tensor fascia was then divided longitudinally to proceed with a direction to approach the hip.  Circumflex vessels were identified and cauterized.  The hip capsule was identified and opened up and out of the format finding a moderate joint effusion.  Cobra retractors were placed around the medial and lateral femoral neck and a femoral neck cut was made with an oscillating saw just proximal to the lesser trochanter and this cut was completed with an osteotome.  A corkscrew was placed in the femoral head and the femoral head was removed in its entirety finding a wide area devoid of cartilage.  A bent Hohmann was then placed over the medial acetabular rim and remnants of the acetabular labrum and other debris removed.  Reaming was then initiated from a size 43 reamer  and stepwise increments going up to a size 51 reamer with all reamers placed under direct visualization and the last reamer also placed under direct fluoroscopy in order to obtain the depth of reaming, the inclination and the anteversion.  The real DePuy sector GRIPTION acetabular component size 52 was then placed without difficulty followed by a 36+4 polyethylene  liner.  Attention was then turned to the femur.  With the right leg externally rotated to 120 degrees, extended and adducted, a Mueller retractor was placed medially and Hohmann tractor behind the greater trochanter.  The lateral joint capsule was released and a box cutting osteotome was used to enter the femoral canal.  Broaching was then initiated using the Actis broaching system from a size 0 going up to a size 5.  With a size 5 in place we trialed a high offset femoral neck based on his anatomy and a 36+1.5 trial head ball.  We brought the leg over up and with traction and internal rotation was used in the pelvis.  Based on radiographic and clinical assessment we felt like we needed a little more leg length.  We dislocated the hip and the trial components.  We then placed the real Actis femoral component with high offset size and with the real 36+5 ceramic head ball.  Again this was reduced in the pelvis we are pleased with stability as well as leg length and offset assessed radiographically mechanically.  The soft tissue was then irrigated with normal saline solution.  Remnants of the joint capsule were closed with interrupted #1 Ethibond suture followed by #1 Vicryl close tensor fascia.  0 Vicryl was used to close the deep tissue and 2-0 Vicryl was used to close subcutaneous tissue.  The skin was closed with staples.  An Aquacel dressing was applied.  The patient was taken off the Hana table and taken the recovery room.  Tory Gaskins, PA-C did assist during the entire case and beginning and his assistance was crucial and medically necessary for soft tissue management and retraction, helping guide implant placement and a layered closure of the wound.

## 2023-12-30 NOTE — Discharge Instructions (Signed)
 Per [facility] clinic policy, our goal is ensure optimal postoperative pain control with a multimodal pain management strategy. For all [facility] patients, our goal is to wean post-operative narcotic medications by 6 weeks post-operatively. If this is not possible due to utilization of pain medication prior to surgery, your [facility] doctor will support your acute post-operative pain control for the first 6 weeks postoperatively, with a plan to transition you back to your primary pain team following that. Maralee will work to ensure a Therapist, occupational.  INSTRUCTIONS AFTER JOINT REPLACEMENT   Remove items at home which could result in a fall. This includes throw rugs or furniture in walking pathways ICE to the affected joint every three hours while awake for 30 minutes at a time, for at least the first 3-5 days, and then as needed for pain and swelling.  Continue to use ice for pain and swelling. You may notice swelling that will progress down to the foot and ankle.  This is normal after surgery.  Elevate your leg when you are not up walking on it.   Continue to use the breathing machine you got in the hospital (incentive spirometer) which will help keep your temperature down.  It is common for your temperature to cycle up and down following surgery, especially at night when you are not up moving around and exerting yourself.  The breathing machine keeps your lungs expanded and your temperature down.   DIET:  As you were doing prior to hospitalization, we recommend a well-balanced diet.  DRESSING / WOUND CARE / SHOWERING  Keep the surgical dressing until follow up.  The dressing is water  proof, so you can shower without any extra covering.  IF THE DRESSING FALLS OFF or the wound gets wet inside, change the dressing with sterile gauze.  Please use good hand washing techniques before changing the dressing.  Do not use any lotions or creams on the incision until instructed by your surgeon.     ACTIVITY  Increase activity slowly as tolerated, but follow the weight bearing instructions below.   No driving for 6 weeks or until further direction given by your physician.  You cannot drive while taking narcotics.  No lifting or carrying greater than 10 lbs. until further directed by your surgeon. Avoid periods of inactivity such as sitting longer than an hour when not asleep. This helps prevent blood clots.  You may return to work once you are authorized by your doctor.     WEIGHT BEARING   Weight bearing as tolerated with assist device (walker, cane, etc) as directed, use it as long as suggested by your surgeon or therapist, typically at least 4-6 weeks.   EXERCISES  Results after joint replacement surgery are often greatly improved when you follow the exercise, range of motion and muscle strengthening exercises prescribed by your doctor. Safety measures are also important to protect the joint from further injury. Any time any of these exercises cause you to have increased pain or swelling, decrease what you are doing until you are comfortable again and then slowly increase them. If you have problems or questions, call your caregiver or physical therapist for advice.   Rehabilitation is important following a joint replacement. After just a few days of immobilization, the muscles of the leg can become weakened and shrink (atrophy).  These exercises are designed to build up the tone and strength of the thigh and leg muscles and to improve motion. Often times heat used for twenty to thirty minutes before  working out will loosen up your tissues and help with improving the range of motion but do not use heat for the first two weeks following surgery (sometimes heat can increase post-operative swelling).   These exercises can be done on a training (exercise) mat, on the floor, on a table or on a bed. Use whatever works the best and is most comfortable for you.    Use music or television  while you are exercising so that the exercises are a pleasant break in your day. This will make your life better with the exercises acting as a break in your routine that you can look forward to.   Perform all exercises about fifteen times, three times per day or as directed.  You should exercise both the operative leg and the other leg as well.  Exercises include:   Quad Sets - Tighten up the muscle on the front of the thigh (Quad) and hold for 5-10 seconds.   Straight Leg Raises - With your knee straight (if you were given a brace, keep it on), lift the leg to 60 degrees, hold for 3 seconds, and slowly lower the leg.  Perform this exercise against resistance later as your leg gets stronger.  Leg Slides: Lying on your back, slowly slide your foot toward your buttocks, bending your knee up off the floor (only go as far as is comfortable). Then slowly slide your foot back down until your leg is flat on the floor again.  Angel Wings: Lying on your back spread your legs to the side as far apart as you can without causing discomfort.  Hamstring Strength:  Lying on your back, push your heel against the floor with your leg straight by tightening up the muscles of your buttocks.  Repeat, but this time bend your knee to a comfortable angle, and push your heel against the floor.  You may put a pillow under the heel to make it more comfortable if necessary.   A rehabilitation program following joint replacement surgery can speed recovery and prevent re-injury in the future due to weakened muscles. Contact your doctor or a physical therapist for more information on knee rehabilitation.    CONSTIPATION  Constipation is defined medically as fewer than three stools per week and severe constipation as less than one stool per week.  Even if you have a regular bowel pattern at home, your normal regimen is likely to be disrupted due to multiple reasons following surgery.  Combination of anesthesia, postoperative  narcotics, change in appetite and fluid intake all can affect your bowels.   YOU MUST use at least one of the following options; they are listed in order of increasing strength to get the job done.  They are all available over the counter, and you may need to use some, POSSIBLY even all of these options:    Drink plenty of fluids (prune juice may be helpful) and high fiber foods Colace 100 mg by mouth twice a day  Senokot for constipation as directed and as needed Dulcolax (bisacodyl ), take with full glass of water   Miralax  (polyethylene glycol) once or twice a day as needed.  If you have tried all these things and are unable to have a bowel movement in the first 3-4 days after surgery call either your surgeon or your primary doctor.    If you experience loose stools or diarrhea, hold the medications until you stool forms back up.  If your symptoms do not get better within 1 week  or if they get worse, check with your doctor.  If you experience the worst abdominal pain ever or develop nausea or vomiting, please contact the office immediately for further recommendations for treatment.   ITCHING:  If you experience itching with your medications, try taking only a single pain pill, or even half a pain pill at a time.  You can also use Benadryl  over the counter for itching or also to help with sleep.   TED HOSE STOCKINGS:  Use stockings on both legs until for at least 2 weeks or as directed by physician office. They may be removed at night for sleeping.  MEDICATIONS:  See your medication summary on the "After Visit Summary" that nursing will review with you.  You may have some home medications which will be placed on hold until you complete the course of blood thinner medication.  It is important for you to complete the blood thinner medication as prescribed.  PRECAUTIONS:  If you experience chest pain or shortness of breath - call 911 immediately for transfer to the hospital emergency department.    If you develop a fever greater that 101 F, purulent drainage from wound, increased redness or drainage from wound, foul odor from the wound/dressing, or calf pain - CONTACT YOUR SURGEON.                                                   FOLLOW-UP APPOINTMENTS:  If you do not already have a post-op appointment, please call the office for an appointment to be seen by your surgeon.  Guidelines for how soon to be seen are listed in your "After Visit Summary", but are typically between 1-4 weeks after surgery.  OTHER INSTRUCTIONS:   Knee Replacement:  Do not place pillow under knee, focus on keeping the knee straight while resting. CPM instructions: 0-90 degrees, 2 hours in the morning, 2 hours in the afternoon, and 2 hours in the evening. Place foam block, curve side up under heel at all times except when in CPM or when walking.  DO NOT modify, tear, cut, or change the foam block in any way.  POST-OPERATIVE OPIOID TAPER INSTRUCTIONS: It is important to wean off of your opioid medication as soon as possible. If you do not need pain medication after your surgery it is ok to stop day one. Opioids include: Codeine, Hydrocodone(Norco, Vicodin), Oxycodone (Percocet, oxycontin ) and hydromorphone  amongst others.  Long term and even short term use of opiods can cause: Increased pain response Dependence Constipation Depression Respiratory depression And more.  Withdrawal symptoms can include Flu like symptoms Nausea, vomiting And more Techniques to manage these symptoms Hydrate well Eat regular healthy meals Stay active Use relaxation techniques(deep breathing, meditating, yoga) Do Not substitute Alcohol to help with tapering If you have been on opioids for less than two weeks and do not have pain than it is ok to stop all together.  Plan to wean off of opioids This plan should start within one week post op of your joint replacement. Maintain the same interval or time between taking each dose  and first decrease the dose.  Cut the total daily intake of opioids by one tablet each day Next start to increase the time between doses. The last dose that should be eliminated is the evening dose.   MAKE SURE YOU:  Understand these instructions.  Get help right away if you are not doing well or get worse.    Thank you for letting us  be a part of your medical care team.  It is a privilege we respect greatly.  We hope these instructions will help you stay on track for a fast and full recovery!     Dental Antibiotics:  In most cases prophylactic antibiotics for Dental procdeures after total joint surgery are not necessary.  Exceptions are as follows:  1. History of prior total joint infection  2. Severely immunocompromised (Organ Transplant, cancer chemotherapy, Rheumatoid biologic meds such as Humera)  3. Poorly controlled diabetes (A1C &gt; 8.0, blood glucose over 200)  If you have one of these conditions, contact your surgeon for an antibiotic prescription, prior to your dental procedure.

## 2023-12-30 NOTE — Anesthesia Preprocedure Evaluation (Signed)
 Anesthesia Evaluation  Patient identified by MRN, date of birth, ID band Patient awake    Reviewed: Allergy & Precautions, NPO status , Patient's Chart, lab work & pertinent test results  History of Anesthesia Complications Negative for: history of anesthetic complications  Airway Mallampati: III  TM Distance: >3 FB Neck ROM: Full    Dental  (+) Teeth Intact, Dental Advisory Given   Pulmonary neg shortness of breath, neg sleep apnea, neg COPD, neg recent URI, former smoker   breath sounds clear to auscultation       Cardiovascular hypertension, Pt. on medications and Pt. on home beta blockers (-) angina (-) Past MI and (-) CHF  Rhythm:Regular     Neuro/Psych  Headaches, neg Seizures PSYCHIATRIC DISORDERS Anxiety        GI/Hepatic Neg liver ROS,GERD  ,,  Endo/Other  negative endocrine ROS    Renal/GU negative Renal ROSLab Results      Component                Value               Date                      NA                       139                 12/24/2023                K                        3.7                 12/24/2023                CO2                      27                  12/24/2023                GLUCOSE                  111 (H)             12/24/2023                BUN                      14                  12/24/2023                CREATININE               1.08                12/24/2023                CALCIUM                  9.8                 12/24/2023                GFRNONAA                 >  60                 12/24/2023                Musculoskeletal  (+) Arthritis ,    Abdominal   Peds  Hematology Lab Results      Component                Value               Date                      WBC                      4.3                 12/24/2023                HGB                      13.9                12/24/2023                HCT                      43.6                12/24/2023                 MCV                      83.8                12/24/2023                PLT                      278                 12/24/2023              Anesthesia Other Findings   Reproductive/Obstetrics                              Anesthesia Physical Anesthesia Plan  ASA: 2  Anesthesia Plan: MAC and Spinal   Post-op Pain Management:    Induction: Intravenous  PONV Risk Score and Plan: 1 and Propofol  infusion, Treatment may vary due to age or medical condition and Midazolam   Airway Management Planned: Nasal Cannula, Natural Airway and Simple Face Mask  Additional Equipment: None  Intra-op Plan:   Post-operative Plan:   Informed Consent: I have reviewed the patients History and Physical, chart, labs and discussed the procedure including the risks, benefits and alternatives for the proposed anesthesia with the patient or authorized representative who has indicated his/her understanding and acceptance.     Dental advisory given  Plan Discussed with: CRNA  Anesthesia Plan Comments:          Anesthesia Quick Evaluation

## 2023-12-30 NOTE — Anesthesia Procedure Notes (Signed)
 Spinal  Patient location during procedure: OR Start time: 12/30/2023 7:29 AM End time: 12/30/2023 7:33 AM Reason for block: surgical anesthesia Staffing Performed: anesthesiologist  Anesthesiologist: Leopoldo Bruckner, MD Performed by: Leopoldo Bruckner, MD Authorized by: Leopoldo Bruckner, MD   Preanesthetic Checklist Completed: patient identified, IV checked, risks and benefits discussed, surgical consent, monitors and equipment checked, pre-op evaluation and timeout performed Spinal Block Patient position: sitting Prep: ChloraPrep Patient monitoring: heart rate, cardiac monitor, continuous pulse ox and blood pressure Approach: midline Location: L4-5 Injection technique: single-shot Needle Needle type: Pencan  Needle gauge: 24 G Needle length: 9 cm Assessment Events: CSF return

## 2023-12-30 NOTE — Plan of Care (Signed)

## 2023-12-30 NOTE — Transfer of Care (Signed)
 Immediate Anesthesia Transfer of Care Note  Patient: Vincent Holt  Procedure(s) Performed: ARTHROPLASTY, HIP, TOTAL, ANTERIOR APPROACH (Right: Hip)  Patient Location: PACU  Anesthesia Type:MAC  Level of Consciousness: drowsy, patient cooperative, and responds to stimulation  Airway & Oxygen Therapy: Patient Spontanous Breathing and Patient connected to face mask oxygen  Post-op Assessment: Report given to RN and Post -op Vital signs reviewed and stable  Post vital signs: Reviewed and stable  Last Vitals:  Vitals Value Taken Time  BP 91/61 12/30/23 09:00  Temp    Pulse 66 12/30/23 09:00  Resp 19 12/30/23 09:00  SpO2 98 % 12/30/23 09:00  Vitals shown include unfiled device data.  Last Pain:  Vitals:   12/30/23 0648  TempSrc:   PainSc: 0-No pain         Complications: No notable events documented.

## 2023-12-31 ENCOUNTER — Encounter (HOSPITAL_COMMUNITY): Payer: Self-pay | Admitting: Orthopaedic Surgery

## 2023-12-31 ENCOUNTER — Telehealth: Payer: Self-pay | Admitting: Orthopaedic Surgery

## 2023-12-31 ENCOUNTER — Other Ambulatory Visit: Payer: Self-pay | Admitting: Orthopaedic Surgery

## 2023-12-31 DIAGNOSIS — M1611 Unilateral primary osteoarthritis, right hip: Secondary | ICD-10-CM | POA: Diagnosis not present

## 2023-12-31 LAB — CBC
HCT: 32.3 % — ABNORMAL LOW (ref 39.0–52.0)
Hemoglobin: 10.6 g/dL — ABNORMAL LOW (ref 13.0–17.0)
MCH: 27.4 pg (ref 26.0–34.0)
MCHC: 32.8 g/dL (ref 30.0–36.0)
MCV: 83.5 fL (ref 80.0–100.0)
Platelets: 196 K/uL (ref 150–400)
RBC: 3.87 MIL/uL — ABNORMAL LOW (ref 4.22–5.81)
RDW: 15.1 % (ref 11.5–15.5)
WBC: 5.7 K/uL (ref 4.0–10.5)
nRBC: 0 % (ref 0.0–0.2)

## 2023-12-31 LAB — BASIC METABOLIC PANEL WITH GFR
Anion gap: 10 (ref 5–15)
BUN: 9 mg/dL (ref 6–20)
CO2: 25 mmol/L (ref 22–32)
Calcium: 9 mg/dL (ref 8.9–10.3)
Chloride: 99 mmol/L (ref 98–111)
Creatinine, Ser: 1.03 mg/dL (ref 0.61–1.24)
GFR, Estimated: >60 mL/min (ref >60)
Glucose, Bld: 134 mg/dL — ABNORMAL HIGH (ref 70–99)
Potassium: 3.3 mmol/L — ABNORMAL LOW (ref 3.5–5.1)
Sodium: 134 mmol/L — ABNORMAL LOW (ref 135–145)

## 2023-12-31 MED ORDER — ASPIRIN 81 MG PO CHEW
81.0000 mg | CHEWABLE_TABLET | Freq: Two times a day (BID) | ORAL | 0 refills | Status: AC
Start: 2023-12-31 — End: ?

## 2023-12-31 MED ORDER — OXYCODONE HCL 5 MG PO TABS: 5.0000 mg | ORAL_TABLET | Freq: Four times a day (QID) | ORAL | 0 refills | Status: DC | PRN

## 2023-12-31 MED ORDER — METHOCARBAMOL 500 MG PO TABS: 500.0000 mg | ORAL_TABLET | Freq: Four times a day (QID) | ORAL | 0 refills | Status: DC | PRN

## 2023-12-31 MED ORDER — OXYCODONE HCL 5 MG PO TABS: 5.0000 mg | ORAL_TABLET | Freq: Four times a day (QID) | ORAL | 0 refills | Status: AC | PRN

## 2023-12-31 MED ORDER — METHOCARBAMOL 500 MG PO TABS: 500.0000 mg | ORAL_TABLET | Freq: Four times a day (QID) | ORAL | 0 refills | Status: AC | PRN

## 2023-12-31 NOTE — Progress Notes (Signed)
 Patient alert and oriented, void, ambulate. Surgical site dressing change per order, no sign of infection. D/c instructions explain and given all questions answered.

## 2023-12-31 NOTE — Telephone Encounter (Signed)
 Patient's wife called. Would like to know if the medication could be sent to [facility] on [address]? [place] the [facility] system is down and the store is closed. Her cb# [phone]

## 2023-12-31 NOTE — Telephone Encounter (Signed)
 Pt submitted medical release form, FMLA, and $20.00 payment

## 2023-12-31 NOTE — Discharge Summary (Signed)
 Patient ID: Tabitha Tupper MRN: [id number] DOB/AGE: 06-12-1965 58 y.o.  Admit date: 12/30/2023 Discharge date: 12/31/2023  Admission Diagnoses:  Principal Problem:   Unilateral primary osteoarthritis, right hip Active Problems:   Status post total replacement of right hip   Discharge Diagnoses:  Same  Past Medical History:  Diagnosis Date   Anemia    hx of   Angio-edema    Anxiety    on meds   Arthritis    bilateral hips   Diverticulosis    GERD (gastroesophageal reflux disease)    on meds   Glaucoma    uses eye drops   Headache    Heart murmur    was told yrs ago-childhood   Hypertension    takes Bisoprolol -HCTZ daily   Joint pain    Joint swelling    Osteoarthritis    Pneumonia    hx of-80's   Urticaria     Surgeries: Procedure(s): ARTHROPLASTY, HIP, TOTAL, ANTERIOR APPROACH on 12/30/2023   Consultants:   Discharged Condition: Improved  Hospital Course: Henderson Frampton is an 58 y.o. male who was admitted 12/30/2023 for operative treatment ofUnilateral primary osteoarthritis, right hip. Patient has severe unremitting pain that affects sleep, daily activities, and work/hobbies. After pre-op clearance the patient was taken to the operating room on 12/30/2023 and underwent  Procedure(s): ARTHROPLASTY, HIP, TOTAL, ANTERIOR APPROACH.    Patient was given perioperative antibiotics:  Anti-infectives (From admission, onward)    Start     Dose/Rate Route Frequency Ordered Stop   12/30/23 1300  ceFAZolin  (ANCEF ) IVPB 2g/100 mL premix        2 g 200 mL/hr over 30 Minutes Intravenous Every 6 hours 12/30/23 1002 12/31/23 1040   12/30/23 0600  ceFAZolin  (ANCEF ) IVPB 2g/100 mL premix        2 g 200 mL/hr over 30 Minutes Intravenous On call to O.R. 12/30/23 0547 12/30/23 0744        Patient was given sequential compression devices, early ambulation, and chemoprophylaxis to prevent DVT.  Inpatient Morphine Milligram Equivalents Per Day 8/12 - 8/13     No orders with  morphine equivalence       Patient benefited maximally from hospital stay and there were no complications.    Recent vital signs: Patient Vitals for the past 24 hrs:  BP Temp Temp src Pulse Resp SpO2  12/31/23 0725 107/68 99.3 F (37.4 C) Oral 81 16 94 %  12/31/23 0313 95/69 99 F (37.2 C) Oral 84 20 99 %  12/30/23 2358 112/64 99.6 F (37.6 C) Oral 85 19 93 %  12/30/23 1926 113/73 99.5 F (37.5 C) Oral 76 18 96 %     Recent laboratory studies:  Recent Labs    12/31/23 0603  WBC 5.7  HGB 10.6*  HCT 32.3*  PLT 196  NA 134*  K 3.3*  CL 99  CO2 25  BUN 9  CREATININE 1.03  GLUCOSE 134*  CALCIUM 9.0     Discharge Medications:   Allergies as of 12/31/2023   No Known Allergies      Medication List     TAKE these medications    amLODipine  10 MG tablet Commonly known as: NORVASC  Take 10 mg by mouth daily.   aspirin  81 MG chewable tablet Chew 1 tablet (81 mg total) by mouth 2 (two) times daily.   EPINEPHrine  0.3 mg/0.3 mL Soaj injection Commonly known as: EPI-PEN Inject 0.3 mg into the muscle as needed for anaphylaxis.   escitalopram  10 MG  tablet Commonly known as: LEXAPRO  Take 10 mg by mouth at bedtime.   famotidine  40 MG tablet Commonly known as: PEPCID  Take 40 mg by mouth 2 (two) times daily.   hydrochlorothiazide  25 MG tablet Commonly known as: HYDRODIURIL  Take 25 mg by mouth daily.   latanoprost  0.005 % ophthalmic solution Commonly known as: XALATAN  Place 1 drop into both eyes at bedtime.   losartan  100 MG tablet Commonly known as: COZAAR  Take 100 mg by mouth daily.   methocarbamol  500 MG tablet Commonly known as: ROBAXIN  Take 1 tablet (500 mg total) by mouth every 6 (six) hours as needed for muscle spasms.   metoprolol  succinate 100 MG 24 hr tablet Commonly known as: TOPROL -XL Take 100 mg by mouth daily.   multivitamin with minerals tablet Take 1 tablet by mouth daily.   oxyCODONE  5 MG immediate release tablet Commonly known as:  Oxy IR/ROXICODONE  Take 1-2 tablets (5-10 mg total) by mouth every 6 (six) hours as needed for moderate pain (pain score 4-6) (pain score 4-6).   timolol  0.5 % ophthalmic solution Commonly known as: TIMOPTIC  Place 1 drop into both eyes in the morning.   triamcinolone cream 0.1 % Commonly known as: KENALOG Apply 1 application topically daily as needed (irritation).               Durable Medical Equipment  (From admission, onward)           Start     Ordered   12/30/23 1002  DME 3 n 1  Once        12/30/23 1002   12/30/23 1002  DME Walker rolling  Once       Question Answer Comment  Walker: With 5 Inch Wheels   Patient needs a walker to treat with the following condition Status post total replacement of right hip      12/30/23 1002            Diagnostic Studies: DG Pelvis Portable Result Date: 12/30/2023 CLINICAL DATA:  Status post right hip replacement EXAM: PORTABLE PELVIS 1-2 VIEWS COMPARISON:  None Available. FINDINGS: Right hip arthroplasty in expected alignment. No periprosthetic lucency or fracture. Recent postsurgical change includes air and edema in the soft tissues. Lateral skin staples in place. Previous left hip arthroplasty. IMPRESSION: Right hip arthroplasty without immediate postoperative complication. Electronically Signed   By: Andrea Gasman M.D.   On: 12/30/2023 10:01   DG HIP UNILAT WITH PELVIS 1V RIGHT Result Date: 12/30/2023 CLINICAL DATA:  Elective surgery. EXAM: DG HIP (WITH OR WITHOUT PELVIS) 1V RIGHT COMPARISON:  None Available. FINDINGS: Eight fluoroscopic spot views of the pelvis and right hip obtained in the operating room. Sequential images during hip arthroplasty. Previous left hip arthroplasty. Fluoroscopy time 22.2 seconds. Dose 2.3497 mGy. IMPRESSION: Intraoperative fluoroscopy during right hip arthroplasty. Electronically Signed   By: Andrea Gasman M.D.   On: 12/30/2023 10:01   DG C-Arm 1-60 Min-No Report Result Date:  12/30/2023 Fluoroscopy was utilized by the requesting physician.  No radiographic interpretation.    Disposition: Discharge disposition: 01-Home or Self Care          Follow-up Information     Vernetta Lonni GRADE, MD Follow up in 2 week(s).   Specialty: Orthopedic Surgery Contact information: [address] [place]  [place] [address] [phone]         [facility] Follow up.   Why: Leopoldo will contact you for the first home visit Contact information: [address]  [place] [address] [phone]                  Signed: Lonni CINDERELLA Poli 12/31/2023, 11:03 AM

## 2023-12-31 NOTE — Progress Notes (Signed)
 Physical Therapy Treatment Patient Details Name: Vincent Holt MRN: [id number] DOB: Nov 14, 1965 Today's Date: 12/31/2023   History of Present Illness 58 y.o. male presents to [facility] hospital on 12/30/2023 for elective R THA. PMH includes L THA, HTN, angioedema.    PT Comments  Pt met his physical therapy goals during his inpatient stay. Pt ambulating household distances with RW, progressing to step through pattern. Reviewed HEP for R hip/knee strengthening and ROM. Pt with fair pain control and is mobilizing well. Recommend follow up PT to maximize functional independence. No further acute PT needs.    If plan is discharge home, recommend the following: A little help with bathing/dressing/bathroom;Assistance with cooking/housework;Assist for transportation;Help with stairs or ramp for entrance   Can travel by private vehicle        Equipment Recommendations  None recommended by PT    Recommendations for Other Services       Precautions / Restrictions Precautions Precautions: Fall Recall of Precautions/Restrictions: Intact Precaution/Restrictions Comments: direct anterior THA Restrictions Weight Bearing Restrictions Per Provider Order: Yes RLE Weight Bearing Per Provider Order: Weight bearing as tolerated     Mobility  Bed Mobility Overal bed mobility: Modified Independent             General bed mobility comments: Pt using arms to assist RLE in and out of bed    Transfers Overall transfer level: Modified independent Equipment used: Rolling walker (2 wheels)                    Ambulation/Gait Ambulation/Gait assistance: Modified independent (Device/Increase time) Gait Distance (Feet): 200 Feet Assistive device: Rolling walker (2 wheels) Gait Pattern/deviations: Step-through pattern, Decreased stance time - right, Decreased weight shift to right, Step-to pattern Gait velocity: reduced     General Gait Details: Progressing to step through pattern, verbal cues  for upright posture and glute activation   Stairs             Wheelchair Mobility     Tilt Bed    Modified Rankin (Stroke Patients Only)       Balance Overall balance assessment: Needs assistance Sitting-balance support: No upper extremity supported, Feet supported Sitting balance-Leahy Scale: Good     Standing balance support: Single extremity supported, Reliant on assistive device for balance Standing balance-Leahy Scale: Poor                              Communication Communication Communication: No apparent difficulties  Cognition Arousal: Alert Behavior During Therapy: WFL for tasks assessed/performed   PT - Cognitive impairments: No apparent impairments                         Following commands: Intact      Cueing Cueing Techniques: Verbal cues  Exercises General Exercises - Lower Extremity Short Arc Quad: AROM, Right, 5 reps, Supine Long Arc Quad: AROM, Right, 5 reps, Seated Heel Slides: AROM, Right, 5 reps, Supine Hip ABduction/ADduction: AROM, Right, 5 reps, Supine    General Comments        Pertinent Vitals/Pain Pain Assessment Pain Assessment: Faces Faces Pain Scale: Hurts little more Pain Location: R hip Pain Descriptors / Indicators: Sore, Operative site guarding Pain Intervention(s): Monitored during session, Premedicated before session    Home Living  Prior Function            PT Goals (current goals can now be found in the care plan section) Acute Rehab PT Goals Patient Stated Goal: to return to independence PT Goal Formulation: With patient Time For Goal Achievement: 01/03/24 Potential to Achieve Goals: Good Progress towards PT goals: Progressing toward goals    Frequency    7X/week      PT Plan      Co-evaluation              AM-PAC PT 6 Clicks Mobility   Outcome Measure  Help needed turning from your back to your side while in a flat bed  without using bedrails?: None Help needed moving from lying on your back to sitting on the side of a flat bed without using bedrails?: None Help needed moving to and from a bed to a chair (including a wheelchair)?: None Help needed standing up from a chair using your arms (e.g., wheelchair or bedside chair)?: None Help needed to walk in hospital room?: None Help needed climbing 3-5 steps with a railing? : A Little 6 Click Score: 23    End of Session Equipment Utilized During Treatment: Gait belt Activity Tolerance: Patient tolerated treatment well Patient left: in bed;with call bell/phone within reach Nurse Communication: Mobility status PT Visit Diagnosis: Other abnormalities of gait and mobility (R26.89);Muscle weakness (generalized) (M62.81);Pain Pain - Right/Left: Right Pain - part of body: Hip     Time: 9199-9168 PT Time Calculation (min) (ACUTE ONLY): 31 min  Charges:    $Gait Training: 8-22 mins $Therapeutic Activity: 8-22 mins PT General Charges $$ ACUTE PT VISIT: 1 Visit                     Aleck Daring, PT, DPT Acute Rehabilitation Services Office [phone]    [name] 12/31/2023, 10:04 AM

## 2023-12-31 NOTE — Progress Notes (Signed)
 Subjective: 1 Day Post-Op Procedure(s) (LRB): ARTHROPLASTY, HIP, TOTAL, ANTERIOR APPROACH (Right) Patient reports pain as moderate.    Objective: Vital signs in last 24 hours: Temp:  [97.8 F (36.6 C)-99.6 F (37.6 C)] 99 F (37.2 C) (08/13 0313) Pulse Rate:  [58-85] 84 (08/13 0313) Resp:  [11-20] 20 (08/13 0313) BP: (91-131)/(61-77) 95/69 (08/13 0313) SpO2:  [93 %-100 %] 99 % (08/13 0313)  Intake/Output from previous day: 08/12 0701 - 08/13 0700 In: 680 [P.O.:480; IV Piggyback:200] Out: 950 [Urine:800; Blood:150] Intake/Output this shift: No intake/output data recorded.  Recent Labs    12/31/23 0603  HGB 10.6*   Recent Labs    12/31/23 0603  WBC 5.7  RBC 3.87*  HCT 32.3*  PLT 196   No results for input(s): NA, K, CL, CO2, BUN, CREATININE, GLUCOSE, CALCIUM in the last 72 hours. No results for input(s): LABPT, INR in the last 72 hours.  Sensation intact distally Intact pulses distally Dorsiflexion/Plantar flexion intact Incision: dressing C/D/I   Assessment/Plan: 1 Day Post-Op Procedure(s) (LRB): ARTHROPLASTY, HIP, TOTAL, ANTERIOR APPROACH (Right) Up with therapy Discharge home with home health      Lonni CINDERELLA Poli 12/31/2023, 7:06 AM

## 2023-12-31 NOTE — Anesthesia Postprocedure Evaluation (Signed)
 Anesthesia Post Note  Patient: Vincent Holt  Procedure(s) Performed: ARTHROPLASTY, HIP, TOTAL, ANTERIOR APPROACH (Right: Hip)     Patient location during evaluation: PACU Anesthesia Type: MAC and Spinal Level of consciousness: awake and alert Pain management: pain level controlled Vital Signs Assessment: post-procedure vital signs reviewed and stable Respiratory status: spontaneous breathing, nonlabored ventilation and respiratory function stable Cardiovascular status: stable and blood pressure returned to baseline Postop Assessment: no apparent nausea or vomiting Anesthetic complications: no   No notable events documented.               Gevork Ayyad

## 2024-01-01 ENCOUNTER — Telehealth: Payer: Self-pay | Admitting: Orthopaedic Surgery

## 2024-01-01 NOTE — Telephone Encounter (Signed)
 Verbal order given

## 2024-01-01 NOTE — Telephone Encounter (Signed)
 JoJo called she is with [facility]. Would like verbal PT orders, 1x 1wk, 2x 3wk, 1x 3wk. Her cb# is [phone]

## 2024-01-01 NOTE — Telephone Encounter (Signed)
 Completed. IC patient for the fax number, he did not one. He asked that I mail the forms to him. FMLA forms mailed to patient.

## 2024-01-07 ENCOUNTER — Other Ambulatory Visit: Payer: Self-pay | Admitting: Orthopaedic Surgery

## 2024-01-12 ENCOUNTER — Telehealth: Payer: Self-pay | Admitting: Orthopaedic Surgery

## 2024-01-12 ENCOUNTER — Ambulatory Visit (INDEPENDENT_AMBULATORY_CARE_PROVIDER_SITE_OTHER): Admitting: Orthopaedic Surgery

## 2024-01-12 ENCOUNTER — Encounter: Payer: Self-pay | Admitting: Orthopaedic Surgery

## 2024-01-12 DIAGNOSIS — Z96641 Presence of right artificial hip joint: Secondary | ICD-10-CM

## 2024-01-12 NOTE — Telephone Encounter (Signed)
 Received call from patient asking for his FMLA forms to be faxed to his employer at [phone]. I faxed per his request.

## 2024-01-12 NOTE — Progress Notes (Signed)
 The patient is here today for his first postoperative visit status post a right total hip replacement.  We did replace his left hip remotely 9 years ago.  He is ambulating with a walker.  He has been taking a baby aspirin  twice a day.  He has been wearing compressive hose as well.  Home therapies are working with him.  He is not taking pain medications.  On exam his right hip incision looks good.  The staples are removed and Steri-Strips applied.  He does have a hematoma but not a seroma.  His calf is soft.  He can stop his baby aspirin  twice daily.  He can stop using the compressive hose but if he does develop foot and ankle swelling he should put these back on.  He can try heating pad around his hip which that will help with decreasing the hematoma with time.  He will slowly increase his activities as comfort allows.  Will see him back in a month to see how he is doing overall but no x-rays are needed.

## 2024-01-14 ENCOUNTER — Telehealth: Payer: Self-pay | Admitting: Orthopaedic Surgery

## 2024-01-14 NOTE — Telephone Encounter (Signed)
 Pt called asking for a call back from Valeria B. Pt states his physical therapist advised him to call to see if he still has restrictions on side movements and stepping backwards on his leg. Pt is asking for a call back at [phone].

## 2024-01-14 NOTE — Telephone Encounter (Signed)
 Patient aware of the below message

## 2024-02-09 ENCOUNTER — Encounter: Payer: Self-pay | Admitting: Orthopaedic Surgery

## 2024-02-09 ENCOUNTER — Ambulatory Visit (INDEPENDENT_AMBULATORY_CARE_PROVIDER_SITE_OTHER): Admitting: Orthopaedic Surgery

## 2024-02-09 DIAGNOSIS — Z96641 Presence of right artificial hip joint: Secondary | ICD-10-CM

## 2024-02-09 NOTE — Progress Notes (Signed)
 The patient is here today for follow-up status post a right total hip arthroplasty done through an anterior approach about 6 weeks ago.  We have replaced his left hip remotely.  He does report that he is making progress with range of motion and strength and is doing well but he still has stiffness and some difficulty with crossing his leg and putting his shoe and sock on the right side.   On exam his left hip that was done many years ago moves smoothly and fluidly.  The right hip does have some stiffness with internal and external rotation but no blocks or rotation.  He does work a job that requires him to sit quite a bit and then stand quite a bit.  He is out of work per [employer] until November.  He would like to consider going back to work sooner.  He will call us  with a return to work date and we would send in a letter for him to return to full work duties without restrictions once he lets us  know.  From our standpoint we will see him back in 6 months with a standing AP pelvis.

## 2024-02-10 ENCOUNTER — Telehealth: Payer: Self-pay | Admitting: Orthopaedic Surgery

## 2024-02-10 NOTE — Telephone Encounter (Signed)
 Ok

## 2024-02-10 NOTE — Telephone Encounter (Signed)
 Faxed to [phone] per patient request

## 2024-02-10 NOTE — Telephone Encounter (Signed)
 Patient called and said he needs a work note to \\go  back to work 10/06 with no restrictions. CB#[phone]

## 2024-03-22 ENCOUNTER — Encounter: Payer: Self-pay | Admitting: Radiology
